# Patient Record
Sex: Female | Born: 1970 | Hispanic: Yes | Smoking: Never smoker
Health system: Southern US, Community
[De-identification: ages and names within clinical notes are randomized; demographics above are authoritative.]

## PROBLEM LIST (undated history)

## (undated) DIAGNOSIS — J45909 Unspecified asthma, uncomplicated: Secondary | ICD-10-CM

## (undated) DIAGNOSIS — Z789 Other specified health status: Secondary | ICD-10-CM

## (undated) HISTORY — PX: RECTAL SURGERY: SHX760

---

## 2008-09-20 ENCOUNTER — Inpatient Hospital Stay (HOSPITAL_COMMUNITY): Admission: EM | Admit: 2008-09-20 | Discharge: 2008-09-21 | Payer: Self-pay | Admitting: Emergency Medicine

## 2010-04-09 LAB — CBC
HCT: 36.4 % (ref 36.0–46.0)
Hemoglobin: 12.7 g/dL (ref 12.0–15.0)
MCHC: 34.8 g/dL (ref 30.0–36.0)
MCV: 96.9 fL (ref 78.0–100.0)
Platelets: 203 10*3/uL (ref 150–400)
RBC: 3.76 MIL/uL — ABNORMAL LOW (ref 3.87–5.11)
RDW: 12.4 % (ref 11.5–15.5)
WBC: 6.5 10*3/uL (ref 4.0–10.5)

## 2010-04-09 LAB — BASIC METABOLIC PANEL WITH GFR
BUN: 5 mg/dL — ABNORMAL LOW (ref 6–23)
CO2: 24 meq/L (ref 19–32)
Calcium: 9.1 mg/dL (ref 8.4–10.5)
Chloride: 104 meq/L (ref 96–112)
Creatinine, Ser: 0.76 mg/dL (ref 0.4–1.2)
GFR calc non Af Amer: >60 mL/min
Glucose, Bld: 93 mg/dL (ref 70–99)
Potassium: 3.7 meq/L (ref 3.5–5.1)
Sodium: 135 meq/L (ref 135–145)

## 2010-04-09 LAB — DIFFERENTIAL
Eosinophils Absolute: 0.1 10*3/uL (ref 0.0–0.7)
Lymphs Abs: 1.7 10*3/uL (ref 0.7–4.0)
Monocytes Relative: 3 % (ref 3–12)
Neutro Abs: 4.5 10*3/uL (ref 1.7–7.7)
Neutrophils Relative %: 69 % (ref 43–77)

## 2010-04-16 ENCOUNTER — Inpatient Hospital Stay (INDEPENDENT_AMBULATORY_CARE_PROVIDER_SITE_OTHER)
Admission: RE | Admit: 2010-04-16 | Discharge: 2010-04-16 | Disposition: A | Discharge status: Home / Self Care | Payer: Medicaid Other | Source: Ambulatory Visit | Attending: Emergency Medicine | Admitting: Emergency Medicine

## 2010-04-16 DIAGNOSIS — J309 Allergic rhinitis, unspecified: Secondary | ICD-10-CM

## 2011-07-22 ENCOUNTER — Encounter (HOSPITAL_COMMUNITY): Payer: Self-pay | Admitting: Nurse Practitioner

## 2011-07-22 ENCOUNTER — Emergency Department (HOSPITAL_COMMUNITY)
Admission: EM | Admit: 2011-07-22 | Discharge: 2011-07-22 | Disposition: A | Discharge status: Home / Self Care | Payer: Medicaid Other | Source: Home / Self Care | Attending: Emergency Medicine | Admitting: Emergency Medicine

## 2011-07-22 DIAGNOSIS — J029 Acute pharyngitis, unspecified: Secondary | ICD-10-CM

## 2011-07-22 DIAGNOSIS — J45909 Unspecified asthma, uncomplicated: Secondary | ICD-10-CM

## 2011-07-22 MED ORDER — CETIRIZINE HCL 10 MG PO CHEW: 10.0000 mg | CHEWABLE_TABLET | Freq: Every day | ORAL | Status: DC

## 2011-07-22 MED ORDER — PREDNISONE 20 MG PO TABS: 40.0000 mg | ORAL_TABLET | Freq: Every day | ORAL | Status: AC

## 2011-07-22 MED ORDER — ALBUTEROL SULFATE HFA 108 (90 BASE) MCG/ACT IN AERS: 1.0000 | INHALATION_SPRAY | Freq: Four times a day (QID) | RESPIRATORY_TRACT | Status: DC | PRN

## 2011-07-22 NOTE — ED Notes (Signed)
Pt. C/o sore and itchy throat for past month. Pt. States she has occasional HA and runny nose.

## 2011-07-22 NOTE — ED Provider Notes (Signed)
History     CSN: [id number]  Arrival date & time 07/22/11  1104   First MD Initiated Contact with Patient 07/22/11 1117      Chief Complaint  Patient presents with  . Sore Throat  . Allergies    (Consider location/radiation/quality/duration/timing/severity/associated sxs/prior treatment) HPI Comments: Patient presents urgent care complaining of a recurrent, ongoing cough with associated wheezing and occasional itchy and a sore throat. Symptoms seem to get well on its own but occasionally last 5-6 days. Today mild headache with a minimal runny nose but no bodyaches, no fevers, no changes in appetite. Patient denies any arthralgias myalgias or abdominal pain. 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  Patient is a 41 y.o. female presenting with pharyngitis. The history is provided by the patient.  Sore Throat This is a new problem. The current episode started more than 1 week ago. The problem occurs constantly. The problem has not changed since onset.Pertinent negatives include no chest pain, no headaches and no shortness of breath. The symptoms are aggravated by swallowing. Nothing relieves the symptoms. She has tried nothing for the symptoms.    History reviewed. No pertinent past medical history.  History reviewed. No pertinent past surgical history.  No family history on file.  History  Substance Use Topics  . Smoking status: Not on file  . Smokeless tobacco: Not on file  . Alcohol Use: Not on file    OB History    Grav Para Term Preterm Abortions TAB SAB Ect Mult Living                  Review of Systems  Constitutional: Negative for fever, chills, activity change and unexpected weight change.  Eyes: Negative for visual disturbance.  Respiratory: Positive for cough. Negative for shortness of breath, wheezing and stridor.   Cardiovascular: Negative for chest pain.  Musculoskeletal: Negative for myalgias, back pain  and joint swelling.  Skin: Negative for color change, rash and wound.  Neurological: Negative for dizziness and headaches.    Allergies  Review of patient's allergies indicates no known allergies.  Home Medications   Current Outpatient Rx  Name Route Sig Dispense Refill  . ALBUTEROL SULFATE HFA 108 (90 BASE) MCG/ACT IN AERS Inhalation Inhale 1-2 puffs into the lungs every 6 (six) hours as needed for wheezing. 1 Inhaler 0  . CETIRIZINE HCL 10 MG PO CHEW Oral Chew 1 tablet (10 mg total) by mouth daily. 14 tablet 0  . PREDNISONE 20 MG PO TABS Oral Take 2 tablets (40 mg total) by mouth daily. 2 tablets daily for 5 days 10 tablet 0    BP 119/79  Pulse 76  Temp 99.1 F (37.3 C) (Oral)  Resp 16  SpO2 100%  LMP 06/29/2011  Physical Exam  Nursing note and vitals reviewed. Constitutional: Vital signs are normal. She appears well-developed and well-nourished.  Non-toxic appearance. She does not have a sickly appearance. She does not appear ill. No distress.  Pulmonary/Chest: Effort normal and breath sounds normal. She has no decreased breath sounds. She has no wheezes. She has no rhonchi. She has no rales.  Abdominal: Soft. She exhibits no distension. There is no hepatosplenomegaly. There is no tenderness. There is no rebound.  Neurological: She is alert.  Skin: No erythema.    ED Course  Procedures (including critical care time)   Labs Reviewed  POCT RAPID STREP A ([facility] ONLY)   No results found.  1. Reactive airway disease   2. Pharyngitis       MDM  Recurrent upper respiratory symptoms most likely induced by repetitive and recurrent chemical exposure. Today patient has obtained prescriptions for Zyrtec for a cycle of 2 weeks prednisone for 5 days and albuterol. Symptoms and signs were not consistent with a respiratory infection. Patient agrees with treatment plan and was instructed to followup if no improvement after 7-10 days.        Jimmie Molly, MD 07/22/11  1153

## 2012-04-27 ENCOUNTER — Emergency Department (HOSPITAL_COMMUNITY)
Admission: EM | Admit: 2012-04-27 | Discharge: 2012-04-27 | Disposition: A | Discharge status: Home / Self Care | Payer: Medicaid Other | Source: Home / Self Care | Attending: Family Medicine | Admitting: Family Medicine

## 2012-04-27 ENCOUNTER — Encounter (HOSPITAL_COMMUNITY): Payer: Self-pay | Admitting: *Deleted

## 2012-04-27 DIAGNOSIS — J302 Other seasonal allergic rhinitis: Secondary | ICD-10-CM

## 2012-04-27 DIAGNOSIS — Z3201 Encounter for pregnancy test, result positive: Secondary | ICD-10-CM

## 2012-04-27 DIAGNOSIS — J309 Allergic rhinitis, unspecified: Secondary | ICD-10-CM

## 2012-04-27 DIAGNOSIS — Z331 Pregnant state, incidental: Secondary | ICD-10-CM

## 2012-04-27 MED ORDER — BUDESONIDE 32 MCG/ACT NA SUSP: 1.0000 | Freq: Two times a day (BID) | NASAL | Status: DC

## 2012-04-27 MED ORDER — DIPHENHYDRAMINE HCL 25 MG PO TABS: 25.0000 mg | ORAL_TABLET | Freq: Four times a day (QID) | ORAL | Status: DC

## 2012-04-27 NOTE — ED Provider Notes (Signed)
History     CSN: [id number]  Arrival date & time 04/27/12  1255   First MD Initiated Contact with Patient 04/27/12 1316      Chief Complaint  Patient presents with  . Nasal Congestion    (Consider location/radiation/quality/duration/timing/severity/associated sxs/prior treatment) Patient is a 42 y.o. female presenting with URI. The history is provided by the patient.  URI Presenting symptoms: congestion, cough and rhinorrhea   Presenting symptoms: no sore throat   Severity:  Moderate Duration:  1 week Progression:  Worsening Chronicity:  Recurrent Associated symptoms: sneezing   Associated symptoms: no wheezing   Risk factors comment:  Concern for being pregnant.   History reviewed. No pertinent past medical history.  No past surgical history on file.  History reviewed. No pertinent family history.  History  Substance Use Topics  . Smoking status: Never Smoker   . Smokeless tobacco: Not on file  . Alcohol Use: No    OB History   Grav Para Term Preterm Abortions TAB SAB Ect Mult Living                  Review of Systems  Constitutional: Negative.   HENT: Positive for congestion, rhinorrhea, sneezing and postnasal drip. Negative for sore throat and trouble swallowing.   Respiratory: Positive for cough. Negative for wheezing.   Cardiovascular: Negative.     Allergies  Review of patient's allergies indicates no known allergies.  Home Medications   Current Outpatient Rx  Name  Route  Sig  Dispense  Refill  . albuterol (PROVENTIL HFA;VENTOLIN HFA) 108 (90 BASE) MCG/ACT inhaler   Inhalation   Inhale 1-2 puffs into the lungs every 6 (six) hours as needed for wheezing.   1 Inhaler   0   . budesonide (RHINOCORT AQUA) 32 MCG/ACT nasal spray   Nasal   Place 1 spray into the nose 2 (two) times daily.   1 Bottle   1   . cetirizine (ZYRTEC) 10 MG chewable tablet   Oral   Chew 1 tablet (10 mg total) by mouth daily.   14 tablet   0   . diphenhydrAMINE  (BENADRYL) 25 MG tablet   Oral   Take 1 tablet (25 mg total) by mouth every 6 (six) hours. For allergies   30 tablet   0     BP 119/70  Pulse 77  Temp(Src) 98.2 F (36.8 C) (Oral)  Resp 16  SpO2 100%  LMP 02/28/2012  Physical Exam  Nursing note and vitals reviewed. Constitutional: She is oriented to person, place, and time. She appears well-developed and well-nourished.  HENT:  Head: Normocephalic.  Right Ear: External ear normal.  Left Ear: External ear normal.  Nose: Mucosal edema and rhinorrhea present.  Mouth/Throat: Oropharynx is clear and moist.  Eyes: Pupils are equal, round, and reactive to light.  Neck: Normal range of motion. Neck supple.  Cardiovascular: Normal rate, regular rhythm, normal heart sounds and intact distal pulses.   Pulmonary/Chest: Effort normal and breath sounds normal.  Abdominal: There is no tenderness.  Neurological: She is alert and oriented to person, place, and time.  Skin: Skin is warm and dry.  Psychiatric: She has a normal mood and affect.    ED Course  Procedures (including critical care time)  Labs Reviewed  POCT PREGNANCY, URINE - Abnormal; Notable for the following:    Preg Test, Ur POSITIVE (*)    All other components within normal limits   No results found.   1. Seasonal  allergic rhinitis   2. Pregnancy as incidental finding       MDM          Linna Hoff, MD 04/27/12 [phone]

## 2012-04-27 NOTE — ED Notes (Signed)
Pt  Reports  Symptoms  Of       Congestion  Allergy  Symptoms           Headaches    For  sev  Months    Pt   Also has  Not  Had  A  Period  In  2  Months      No Birth  Control

## 2012-06-04 ENCOUNTER — Other Ambulatory Visit (HOSPITAL_COMMUNITY)
Admission: RE | Admit: 2012-06-04 | Discharge: 2012-06-04 | Disposition: A | Discharge status: Home / Self Care | Payer: Medicaid Other | Source: Ambulatory Visit | Attending: Obstetrics and Gynecology | Admitting: Obstetrics and Gynecology

## 2012-06-04 ENCOUNTER — Ambulatory Visit (INDEPENDENT_AMBULATORY_CARE_PROVIDER_SITE_OTHER): Payer: Medicaid Other | Admitting: Obstetrics and Gynecology

## 2012-06-04 ENCOUNTER — Encounter: Payer: Self-pay | Admitting: Obstetrics and Gynecology

## 2012-06-04 VITALS — BP 113/66 | Temp 97.2°F | Ht 67.0 in | Wt 149.2 lb

## 2012-06-04 DIAGNOSIS — O09219 Supervision of pregnancy with history of pre-term labor, unspecified trimester: Secondary | ICD-10-CM | POA: Insufficient documentation

## 2012-06-04 DIAGNOSIS — J4520 Mild intermittent asthma, uncomplicated: Secondary | ICD-10-CM

## 2012-06-04 DIAGNOSIS — O09529 Supervision of elderly multigravida, unspecified trimester: Secondary | ICD-10-CM | POA: Insufficient documentation

## 2012-06-04 DIAGNOSIS — J45909 Unspecified asthma, uncomplicated: Secondary | ICD-10-CM

## 2012-06-04 DIAGNOSIS — O09212 Supervision of pregnancy with history of pre-term labor, second trimester: Secondary | ICD-10-CM

## 2012-06-04 DIAGNOSIS — Z1151 Encounter for screening for human papillomavirus (HPV): Secondary | ICD-10-CM | POA: Insufficient documentation

## 2012-06-04 DIAGNOSIS — Z01419 Encounter for gynecological examination (general) (routine) without abnormal findings: Secondary | ICD-10-CM | POA: Insufficient documentation

## 2012-06-04 DIAGNOSIS — Z113 Encounter for screening for infections with a predominantly sexual mode of transmission: Secondary | ICD-10-CM | POA: Insufficient documentation

## 2012-06-04 LAB — POCT URINALYSIS DIP (DEVICE)
Leukocytes, UA: NEGATIVE
Protein, ur: NEGATIVE mg/dL
Specific Gravity, Urine: >=1.03 (ref 1.005–1.030)
Urobilinogen, UA: 1 mg/dL (ref 0.0–1.0)

## 2012-06-04 NOTE — Progress Notes (Signed)
   Subjective:    Monica Blake is a [id number] [redacted]w[redacted]d being seen today for her first obstetrical visit.  Her obstetrical history is significant for PROM/PTB with second pregnancy (records pending). RF for GDM (Hispanic and AMA) but early glucola not done at [facility] (near 5 pm so will come back for lab). Patient does intend to breast feed. Pregnancy history fully reviewed.  Patient reports breasts sore and occasional nausea.  Filed Vitals:   06/04/12 1521 06/04/12 1533  BP: 113/66   Temp: 97.2 F (36.2 C)   Height:  5\' 7"  (1.702 m)  Weight: 67.677 kg (149 lb 3.2 oz)     HISTORY: OB History   Grav Para Term Preterm Abortions TAB SAB Ect Mult Living   4 3 2 1      3      # Outc Date GA Lbr Len/2nd Wgt Sex Del Anes PTL Lv   1 TRM 9/96 [redacted]w[redacted]d  3.175kg(7lb) F SVD None  Yes   2 PRE 7/98 [redacted]w[redacted]d  1.361kg(3lb) M SVD None Yes Yes   3 TRM 11/00 [redacted]w[redacted]d  3.629kg(8lb) M SVD None  Yes   4 CUR              Past Medical History  Diagnosis Date  . Preterm labor     1998   Past Surgical History  Procedure Laterality Date  . Rectal surgery     History reviewed. No pertinent family history.   Exam    Uterus:     Pelvic Exam:    Perineum: Normal Perineum   Vulva: normal, Bartholin's, Urethra, Skene's normal   Vagina:  normal mucosa, normal discharge       Cervix: multiparous appearance   Adnexa: not evaluated   Bony Pelvis: gynecoid  System: Breast:  normal appearance, no masses or tenderness   Skin: normal coloration and turgor, no rashes    Neurologic: oriented, normal, grossly non-focal   Extremities: normal strength, tone, and muscle mass   HEENT PERRLA, neck supple with midline trachea and thyroid without masses   Mouth/Teeth mucous membranes moist, pharynx normal without lesions and dental hygiene good   Neck supple and no masses   Cardiovascular: regular rate and rhythm no murmur   Respiratory:  appears well, vitals normal, no respiratory distress, acyanotic, normal RR, ear and  throat exam is normal, neck free of mass or lymphadenopathy, chest clear, no wheezing, crepitations, rhonchi, normal symmetric air entry   Abdomen: S=D. DT 150, NT   Urinary: urethral meatus normal      Assessment:    Pregnancy: [id number] Patient Active Problem List   Diagnosis Date Noted  . AMA (advanced maternal age) multigravida 35+ 06/04/2012  . Pregnancy complicated by previous preterm labor 06/04/2012        Plan:     Initial labs drawn. Prenatal vitamins. Problem list reviewed and updated. Genetic Screening discussed [place] Screen: undecided.  Ultrasound discussed; fetal survey: requested.  Follow up in 2 weeks. Will get glucola and F/U on offered genetic testing and 17-P 50% of 30 min visit spent on counseling and coordination of care.  Interpreter used   [employer] 06/04/2012

## 2012-06-04 NOTE — Patient Instructions (Signed)
Embarazo - Segundo trimestre (Pregnancy - Second Trimester) El segundo trimestre del embarazo (del 3 al 6 mes) es un perodo de evolucin rpida para usted y el beb. Hacia el final del sexto mes, el beb mide aproximadamente 23 cm y pesa 680 g. Comenzar a sentir los movimientos del beb entre las 18 y las 20 semanas de embarazo. Podr sentir las pataditas ("quickening en ingls"). Hay un rpido aumento de peso. Puede segregar un lquido claro (calostro) de las mamas. Quizs sienta pequeas contracciones en el vientre (tero) Esto se conoce como falso trabajo de parto o contracciones de Braxton-Hicks. Es como una prctica del trabajo de parto que se produce cuando el beb est listo para salir. Generalmente los problemas de vmitos matinales ya se han superado hacia el final del primer trimestre. Algunas mujeres desarrollan pequeas manchas oscuras (que se denominan cloasma, mscara del embarazo) en la cara que normalmente se van luego del nacimiento del beb. La exposicin al sol empeora las manchas. Puede desarrollarse acn en algunas mujeres embarazadas, y puede desaparecer en aquellas que ya tienen acn. EXAMENES PRENATALES  Durante los exmenes prenatales, deber seguir realizando pruebas de sangre, segn avance el embarazo. Estas pruebas se realizan para controlar su salud y la del beb. Tambin se realizan anlisis de sangre para conocer los niveles de hemoglobina. La anemia (bajo nivel de hemoglobina) es frecuente durante el embarazo. Para prevenirla, se administran hierro y vitaminas. Tambin se le realizarn exmenes para saber si tiene diabetes entre las 24 y las 28 semanas del embarazo. Podrn repetirle algunas de las pruebas que le hicieron previamente.  En cada visita le medirn el tamao del tero. Esto se realiza para asegurarse de que el beb est creciendo correctamente de acuerdo al estado del embarazo.  Tambin en cada visita prenatal controlarn su presin arterial. Esto se realiza  para asegurarse de que no tenga toxemia.  Se controlar su orina para asegurarse de que no tenga infecciones, diabetes o protena en la orina.  Se controlar su peso regularmente para asegurarse que el aumento ocurre al ritmo indicado. Esto se hace para asegurarse que usted y el beb tienen una evolucin normal.  En algunas ocasiones se realiza una prueba de ultrasonido para confirmar el correcto desarrollo y evolucin del beb. Esta prueba se realiza con ondas sonoras inofensivas para el beb, de modo que el profesional pueda calcular ms precisamente la fecha del parto. Algunas veces se realizan pruebas especializadas del lquido amnitico que rodea al beb. Esta prueba se denomina amniocentesis. El lquido amnitico se obtiene introduciendo una aguja en el vientre (abdomen). Se realiza para controlar los cromosomas en aquellos casos en los que existe alguna preocupacin acerca de algn problema gentico que pueda sufrir el beb. En ocasiones se lleva a cabo cerca del final del embarazo, si es necesario inducir al parto. En este caso se realiza para asegurarse que los pulmones del beb estn lo suficientemente maduros como para que pueda vivir fuera del tero. CAMBIOS QUE OCURREN EN EL SEGUNDO TRIMESTRE DEL EMBARAZO Su organismo atravesar numerosos cambios durante el embarazo. Estos pueden variar de una persona a otra. Converse con el profesional que la asiste acerca los cambios que usted note y que la preocupen.  Durante el segundo trimestre probablemente sienta un aumento del apetito. Es normal tener "antojos" de ciertas comidas. Esto vara de una persona a otra y de un embarazo a otro.  El abdomen inferior comenzar a abultarse.  Podr tener la necesidad de orinar con ms frecuencia debido a   que el tero y el beb presionan sobre la vejiga. Tambin es frecuente contraer ms infecciones urinarias durante el embarazo. Puede evitarlas bebiendo gran cantidad de lquidos y vaciando la vejiga antes y  despus de mantener relaciones sexuales.  Podrn aparecer las primeras estras en las caderas, abdomen y mamas. Estos son cambios normales del cuerpo durante el embarazo. No existen medicamentos ni ejercicios que puedan prevenir estos cambios.  Es posible que comience a desarrollar venas inflamadas y abultadas (varices) en las piernas. El uso de medias de descanso, elevar sus pies durante 15 minutos, 3 a 4 veces al da y limitar la sal en su dieta ayuda a aliviar el problema.  Podr sentir acidez gstrica a medida que el tero crece y presiona contra el estmago. Puede tomar anticidos, con la autorizacin de su mdico, para aliviar este problema. Tambin es til ingerir pequeas comidas 4 a 5 veces al da.  La constipacin puede tratarse con un laxante o agregando fibra a su dieta. Beber grandes cantidades de lquidos, comer vegetales, frutas y granos integrales es de gran ayuda.  Tambin es beneficioso practicar actividad fsica. Si ha sido una persona activa hasta el embarazo, podr continuar con la mayora de las actividades durante el mismo. Si ha sido menos activa, puede ser beneficioso que comience con un programa de ejercicios, como realizar caminatas.  Puede desarrollar hemorroides hacia el final del segundo trimestre. Tomar baos de asiento tibios y utilizar cremas recomendadas por el profesional que lo asiste sern de ayuda para los problemas de hemorroides.  Tambin podr sentir dolor de espalda durante este momento de su embarazo. Evite levantar objetos pesados, utilice zapatos de taco bajo y mantenga una buena postura para ayudar a reducir los problemas de espalda.  Algunas mujeres embarazadas desarrollan hormigueo y adormecimiento de la mano y los dedos debido a la hinchazn y compresin de los ligamentos de la mueca (sndrome del tnel carpiano). Esto desaparece una vez que el beb nace.  Como sus pechos se agrandan, necesitar un sujetador ms grande. Use un sostn de soporte,  cmodo y de algodn. No utilice un sostn para amamantar hasta el ltimo mes de embarazo si va a amamantar al beb.  Podr observar una lnea oscura desde el ombligo hacia la zona pbica denominada linea nigra.  Podr observar que sus mejillas se ponen coloradas debido al aumento de flujo sanguneo en la cara.  Podr desarrollar "araitas" en la cara, cuello y pecho. Esto desaparece una vez que el beb nace. INSTRUCCIONES PARA EL CUIDADO DOMICILIARIO  Es extremadamente importante que evite el cigarrillo, hierbas medicinales, alcohol y las drogas no prescriptas durante el embarazo. Estas sustancias qumicas afectan la formacin y el desarrollo del beb. Evite estas sustancias durante todo el embarazo para asegurar el nacimiento de un beb sano.  La mayor parte de los cuidados que se aconsejan son los mismos que los indicados para el primer trimestre del embarazo. Cumpla con las citas tal como se le indic. Siga las instrucciones del profesional que lo asiste con respecto al uso de los medicamentos, el ejercicio y la dieta.  Durante el embarazo debe obtener nutrientes para usted y para su beb. Consuma alimentos balanceados a intervalos regulares. Elija alimentos como carne, pescado, leche y otros productos lcteos descremados, vegetales, frutas, panes integrales y cereales. El profesional le informar cul es el aumento de peso ideal.  Las relaciones sexuales fsicas pueden continuarse hasta cerca del fin del embarazo si no existen otros problemas. Estos problemas pueden ser la prdida temprana (  prematura) de lquido amnitico de las membranas, sangrado vaginal, dolor abdominal u otros problemas mdicos o del embarazo.  Realice actividad fsica todos los das, si no tiene restricciones. Consulte con el profesional que la asiste si no sabe con certeza si determinados ejercicios son seguros. El mayor aumento de peso tiene lugar durante los ltimos 2 trimestres del embarazo. El ejercicio la ayudar  a:  Controlar su peso.  Ponerla en forma para el parto.  Ayudarla a perder peso luego de haber dado a luz.  Use un buen sostn o como los que se usan para hacer deportes para aliviar la sensibilidad de las mamas. Tambin puede serle til si lo usa mientras duerme. Si pierde calostro, podr utilizar apsitos en el sostn.  No utilice la baera con agua caliente, baos turcos y saunas durante el embarazo.  Utilice el cinturn de seguridad sin excepcin cuando conduzca. Este la proteger a usted y al beb en caso de accidente.  Evite comer carne cruda, queso crudo, y el contacto con los utensilios y desperdicios de los gatos. Estos elementos contienen grmenes que pueden causar defectos de nacimiento en el beb.  El segundo trimestre es un buen momento para visitar a su dentista y evaluar su salud dental si an no lo ha hecho. Es importante mantener los dientes limpios. Utilice un cepillo de dientes blando. Cepllese ms suavemente durante el embarazo.  Es ms fcil perder algo de orina durante el embarazo. Apretar y fortalecer los msculos de la pelvis la ayudar con este problema. Practique detener la miccin cuando est en el bao. Estos son los mismos msculos que necesita fortalecer. Son tambin los mismos msculos que utiliza cuando trata de evitar los gases. Puede practicar apretando estos msculos 10 veces, y repetir esto 3 veces por da aproximadamente. Una vez que conozca qu msculos debe apretar, no realice estos ejercicios durante la miccin. Puede favorecerle una infeccin si la orina vuelve hacia atrs.  Pida ayuda si tiene necesidades econmicas, de asesoramiento o nutricionales durante el embarazo. El profesional podr ayudarla con respecto a estas necesidades, o derivarla a otros especialistas.  La piel puede ponerse grasa. Si esto sucede, lvese la cara con un jabn suave, utilice un humectante no graso y maquillaje con base de aceite o crema. CONSUMO DE MEDICAMENTOS Y DROGAS  DURANTE EL EMBARAZO  Contine tomando las vitaminas apropiadas para esta etapa tal como se le indic. Las vitaminas deben contener un miligramo de cido flico y deben suplementarse con hierro. Guarde todas las vitaminas fuera del alcance de los nios. La ingestin de slo un par de vitaminas o tabletas que contengan hierro puede ocasionar la muerte en un beb o en un nio pequeo.  Evite el uso de medicamentos, inclusive los de venta libre y hierbas que no hayan sido prescriptos o indicados por el profesional que la asiste. Algunos medicamentos pueden causar problemas fsicos al beb. Utilice los medicamentos de venta libre o de prescripcin para el dolor, el malestar o la fiebre, segn se lo indique el profesional que lo asiste. No utilice aspirina.  El consumo de alcohol est relacionado con ciertos defectos de nacimiento. Esto incluye el sndrome de alcoholismo fetal. Debe evitar el consumo de alcohol en cualquiera de sus formas. El cigarrillo causa nacimientos prematuros y bebs de bajo peso. El uso de drogas recreativas est absolutamente prohibido. Son muy nocivas para el beb. Un beb que nace de una madre adicta, ser adicto al nacer. Ese beb tendr los mismos sntomas de abstinencia que un adulto.    Infrmele al profesional si consume alguna droga.  No consuma drogas ilegales. Pueden causarle mucho dao al beb. SOLICITE ATENCIN MDICA SI: Tiene preguntas o preocupaciones durante su embarazo. Es mejor que llame para consultar las dudas que esperar hasta su prxima visita prenatal. De esta forma se sentir ms tranquila.  SOLICITE ATENCIN MDICA DE INMEDIATO SI:  La temperatura oral se eleva sin motivo por encima de 102 F (38.9 C) o segn le indique el profesional que lo asiste.  Tiene una prdida de lquido por la vagina (canal de parto). Si sospecha una ruptura de las membranas, tmese la temperatura y llame al profesional para informarlo sobre esto.  Observa unas pequeas manchas,  una hemorragia vaginal o elimina cogulos. Notifique al profesional acerca de la cantidad y de cuntos apsitos est utilizando. Unas pequeas manchas de sangre son algo comn durante el embarazo, especialmente despus de mantener relaciones sexuales.  Presenta un olor desagradable en la secrecin vaginal y observa un cambio en el color, de transparente a blanco.  Contina con las nuseas y no obtiene alivio de los remedios indicados. Vomita sangre o algo similar a la borra del caf.  [place] o sube ms de 900 g. en una semana, o segn lo indicado por el profesional que la asiste.  Observa que se le hinchan el rostro, las manos, los pies o las piernas.  Ha estado expuesta a la rubola y no ha sufrido la enfermedad.  Ha estado expuesta a la quinta enfermedad o a la varicela.  Presenta dolor abdominal. Las molestias en el ligamento redondo son una causa no cancerosa (benigna) frecuente de dolor abdominal durante el embarazo. El profesional que la asiste deber evaluarla.  Presenta dolor de cabeza intenso que no se alivia.  Presenta fiebre, diarrea, dolor al orinar o le falta la respiracin.  Presenta dificultad para ver, visin borrosa, o visin doble.  Sufre una cada, un accidente de trnsito o cualquier tipo de trauma.  Vive en un hogar en el que existe violencia fsica o mental. Document Released: 09/29/2004 Document Revised: 09/14/2011 [employer] Patient Information 2014 [employer].  

## 2012-06-04 NOTE — Progress Notes (Signed)
Pulse- 77  Pain-breast tenderness Weight gain 25-35lb  New ob packet given

## 2012-06-05 LAB — OBSTETRIC PANEL
Antibody Screen: NEGATIVE
Eosinophils Relative: 7 % — ABNORMAL HIGH (ref 0–5)
HCT: 34 % — ABNORMAL LOW (ref 36.0–46.0)
Lymphocytes Relative: 33 % (ref 12–46)
Lymphs Abs: 2 10*3/uL (ref 0.7–4.0)
MCV: 92.1 fL (ref 78.0–100.0)
Monocytes Absolute: 0.3 10*3/uL (ref 0.1–1.0)
RBC: 3.69 MIL/uL — ABNORMAL LOW (ref 3.87–5.11)
Rubella: 4.17 Index — ABNORMAL HIGH (ref <0.90)
WBC: 6.1 10*3/uL (ref 4.0–10.5)

## 2012-06-05 LAB — HIV ANTIBODY (ROUTINE TESTING W REFLEX): HIV: NONREACTIVE

## 2012-06-06 LAB — HEMOGLOBINOPATHY EVALUATION
Hemoglobin Other: 0 %
Hgb A: 96.9 % (ref 96.8–97.8)

## 2012-06-18 ENCOUNTER — Ambulatory Visit (INDEPENDENT_AMBULATORY_CARE_PROVIDER_SITE_OTHER): Payer: Medicaid Other | Admitting: Family

## 2012-06-18 VITALS — BP 100/69 | Wt 148.6 lb

## 2012-06-18 DIAGNOSIS — O0992 Supervision of high risk pregnancy, unspecified, second trimester: Secondary | ICD-10-CM

## 2012-06-18 DIAGNOSIS — O09529 Supervision of elderly multigravida, unspecified trimester: Secondary | ICD-10-CM

## 2012-06-18 DIAGNOSIS — O0990 Supervision of high risk pregnancy, unspecified, unspecified trimester: Secondary | ICD-10-CM | POA: Insufficient documentation

## 2012-06-18 NOTE — Progress Notes (Signed)
Reviewed screening for genetic issues in pregnancy and increased risk due to age > [place] Screen and detailed ultrasound; early 1 hr today.  Urine results not available at discharge.

## 2012-06-18 NOTE — Progress Notes (Signed)
Pulse: 79 Couldn't afford the rx for nose spray. Would like a cheaper alternative.

## 2012-06-19 ENCOUNTER — Encounter: Payer: Self-pay | Admitting: Family

## 2012-07-03 ENCOUNTER — Ambulatory Visit (HOSPITAL_COMMUNITY)
Admission: RE | Admit: 2012-07-03 | Discharge: 2012-07-03 | Disposition: A | Discharge status: Home / Self Care | Payer: Medicaid Other | Source: Ambulatory Visit | Attending: Family | Admitting: Family

## 2012-07-03 ENCOUNTER — Encounter (HOSPITAL_COMMUNITY): Payer: Self-pay

## 2012-07-03 ENCOUNTER — Other Ambulatory Visit: Payer: Self-pay

## 2012-07-03 DIAGNOSIS — Z1389 Encounter for screening for other disorder: Secondary | ICD-10-CM | POA: Insufficient documentation

## 2012-07-03 DIAGNOSIS — Z8751 Personal history of pre-term labor: Secondary | ICD-10-CM | POA: Insufficient documentation

## 2012-07-03 DIAGNOSIS — O0992 Supervision of high risk pregnancy, unspecified, second trimester: Secondary | ICD-10-CM

## 2012-07-03 DIAGNOSIS — O09299 Supervision of pregnancy with other poor reproductive or obstetric history, unspecified trimester: Secondary | ICD-10-CM | POA: Insufficient documentation

## 2012-07-03 DIAGNOSIS — O358XX0 Maternal care for other (suspected) fetal abnormality and damage, not applicable or unspecified: Secondary | ICD-10-CM | POA: Insufficient documentation

## 2012-07-03 DIAGNOSIS — Z363 Encounter for antenatal screening for malformations: Secondary | ICD-10-CM | POA: Insufficient documentation

## 2012-07-03 DIAGNOSIS — O09529 Supervision of elderly multigravida, unspecified trimester: Secondary | ICD-10-CM | POA: Insufficient documentation

## 2012-07-03 NOTE — Progress Notes (Signed)
Genetic Counseling  High-Risk Gestation Note  Appointment Date:  07/03/2012 Referred By: Melissa Noon, CNM Date of Birth:  [birth year]    Pregnancy History: [id number] Estimated Date of Delivery: 11/30/12 Estimated Gestational Age: [redacted]w[redacted]d Attending: Particia Nearing, MD  [name] was seen for genetic counseling because of a maternal age of 42 y.o.. [facility] Spanish/English medical interpreter, Monica Blake was present and provided interpretation during today's session.    She was counseled regarding maternal age and the association with risk for chromosome conditions due to nondisjunction with aging of the ova.   We reviewed chromosomes, nondisjunction, and the associated 1 in 59 risk for fetal aneuploidy related to a maternal age of 42 y.o. at [redacted]w[redacted]d gestation.  She was counseled that the risk for aneuploidy decreases as gestational age increases, accounting for those pregnancies which spontaneously abort.  We specifically discussed Down syndrome (trisomy 23), trisomies 78 and 69, and sex chromosome aneuploidies (47,XXX and 47,XXY) including the common features and prognoses of each.   We reviewed available screening options including Quad screen, noninvasive prenatal screening (NIPS)/cell free fetal DNA (cffDNA) testing, and detailed ultrasound.  She was counseled that screening tests are used to modify a patient's a priori risk for aneuploidy, typically based on age. This estimate provides a pregnancy specific risk assessment. We reviewed the benefits and limitations of each option. Specifically, we discussed the conditions for which each test screens, the detection rates, and false positive rates of each. She was also counseled regarding diagnostic testing via amniocentesis. We reviewed the approximate 1 in 300-500 risk for complications for amniocentesis, including spontaneous pregnancy loss. After consideration of all the options, she elected to proceed with NIPS (Panorama) at the time of  today's visit.  Those results will be available in approximately 8-10 days.    The patient also expressed interest in having a detailed ultrasound.  A complete ultrasound was performed today. The ultrasound report will be sent under separate cover. There were no visualized fetal anomalies or markers suggestive of aneuploidy. Diagnostic testing was declined today given the associated risk of complications.  She understands that screening tests cannot rule out all birth defects or genetic syndromes. The patient was advised of this limitation.   [name] was provided with written information regarding sickle cell anemia (SCA) including the carrier frequency and incidence in the Hispanic and African populations, the availability of carrier testing and prenatal diagnosis if indicated.  In addition, we discussed that hemoglobinopathies are routinely screened for as part of the [place] newborn screening panel.  She previously had hemoglobin electrophoresis performed through her OB office, which indicated the presence of normal adult hemoglobin. Thus, she does not appear to carry sickle cell trait or other hemoglobin variant.   Both family histories were reviewed and found to be contributory for speech delays for the patient's maternal uncle. He reportedly also has behavior issues and will get angry easily. He lives with his sister, who cares for him. He is otherwise healthy. No underlying cause is known for his cognitive delays, and he reportedly has no physical differences from relatives. [name] was counseled that there are many different causes of intellectual disabilities including environmental, multifactorial, and genetic etiologies.  We discussed that a specific diagnosis for intellectual disability can be determined in approximately 50% of these individuals.  In the remaining 50% of individuals, a diagnosis may never be determined.  Regarding genetic causes, we discussed that chromosome aberrations  (aneuploidy, deletions, duplications, insertions, and translocations) are responsible  for a small percentage of individuals with intellectual disability.  Many individuals with chromosome aberrations have additional differences, including congenital anomalies or minor dysmorphisms.  Likewise, single gene conditions are the underlying cause of intellectual delay in some families.  We discussed that many gene conditions have intellectual disability as a feature, but also often include other physical or medical differences. We discussed that without more specific information, it is difficult to provide an accurate risk assessment.  Further genetic counseling is warranted if more information is obtained.  Additionally, the patient reported that her mother has a history of 3 or 4 miscarriages. An underlying cause is not known. Approximately 1 in 6 confirmed pregnancies results in miscarriage. A single underlying cause is more likely to be suspected when a couple has experienced 3 or more losses. Several possible causes including chromosome rearrangements, antibodies, and thrombophilia. We discussed that for the majority of cases, a relative with recurrent pregnancy loss would not likely have implications for other relatives. However, additional information is needed regarding the underlying etiology in order to more accurately assess potential implications for relatives. Without further information regarding the provided family history, an accurate genetic risk cannot be calculated. Further genetic counseling is warranted if more information is obtained.  [name] reported that the father of the pregnancy is 12 years old. Advanced paternal age (APA) is defined as paternal age greater than or equal to age 4.  Recent large-scale sequencing studies have shown that approximately 80% of de novo point mutations are of paternal origin.  Many studies have demonstrated a strong correlation between increased paternal age  and de novo point mutations.  Although no specific data is available regarding fetal risks for fathers 11+ years old at conception, it is apparent that the overall risk for single gene conditions is increased.  To estimate the relative increase in risk of a genetic disorder with APA, the heritability of the disease must be considered.  Assuming an approximate 2x increase in risk for conditions that are exclusively paternal in origin, the risk for each individual condition is still relatively low.  It is estimated that the overall chance for a de novo mutation is ~0.5%.  We also discussed the wide range of conditions which can be caused by new dominant gene mutations (achondroplasia, neurofibromatosis, Marfan syndrome etc.).  She was counseled that genetic testing for each individual single gene condition is not warranted or available unless ultrasound or family history concerns lend suspicion to a specific condition.        [name] denied exposure to environmental toxins or chemical agents. She denied the use of alcohol, tobacco or street drugs. She denied significant viral illnesses during the course of her pregnancy. Her medical and surgical histories were contributory for history of preterm delivery with her second child.   I counseled [name] regarding the above risks and available options.  The approximate face-to-face time with the genetic counselor was 45 minutes.  Quinn Plowman, MS,  Certified Genetic Counselor 07/03/2012

## 2012-07-05 ENCOUNTER — Encounter: Payer: Self-pay | Admitting: Family

## 2012-07-12 ENCOUNTER — Ambulatory Visit (INDEPENDENT_AMBULATORY_CARE_PROVIDER_SITE_OTHER): Payer: Medicaid Other | Admitting: Obstetrics & Gynecology

## 2012-07-12 VITALS — BP 87/60 | Temp 97.5°F | Wt 153.0 lb

## 2012-07-12 DIAGNOSIS — O09212 Supervision of pregnancy with history of pre-term labor, second trimester: Secondary | ICD-10-CM

## 2012-07-12 DIAGNOSIS — O09219 Supervision of pregnancy with history of pre-term labor, unspecified trimester: Secondary | ICD-10-CM

## 2012-07-12 DIAGNOSIS — O0992 Supervision of high risk pregnancy, unspecified, second trimester: Secondary | ICD-10-CM

## 2012-07-12 LAB — POCT URINALYSIS DIP (DEVICE)
Bilirubin Urine: NEGATIVE
Ketones, ur: NEGATIVE mg/dL
Leukocytes, UA: NEGATIVE
pH: 7 (ref 5.0–8.0)

## 2012-07-12 NOTE — Progress Notes (Signed)
Pulse 73 2nd BP: 90/59  C/o pain/parasthesia on left leg.

## 2012-07-12 NOTE — Patient Instructions (Addendum)
Embarazo - Segundo trimestre (Pregnancy - Second Trimester) El segundo trimestre del embarazo (del 3 al 6 mes) es un perodo de evolucin rpida para usted y el beb. Hacia el final del sexto mes, el beb mide aproximadamente 23 cm y pesa 680 g. Comenzar a sentir los movimientos del beb entre las 18 y las 20 semanas de embarazo. Podr sentir las pataditas ("quickening en ingls"). Hay un rpido aumento de peso. Puede segregar un lquido claro (calostro) de las mamas. Quizs sienta pequeas contracciones en el vientre (tero) Esto se conoce como falso trabajo de parto o contracciones de Braxton-Hicks. Es como una prctica del trabajo de parto que se produce cuando el beb est listo para salir. Generalmente los problemas de vmitos matinales ya se han superado hacia el final del primer trimestre. Algunas mujeres desarrollan pequeas manchas oscuras (que se denominan cloasma, mscara del embarazo) en la cara que normalmente se van luego del nacimiento del beb. La exposicin al sol empeora las manchas. Puede desarrollarse acn en algunas mujeres embarazadas, y puede desaparecer en aquellas que ya tienen acn. EXAMENES PRENATALES  Durante los exmenes prenatales, deber seguir realizando pruebas de sangre, segn avance el embarazo. Estas pruebas se realizan para controlar su salud y la del beb. Tambin se realizan anlisis de sangre para conocer los niveles de hemoglobina. La anemia (bajo nivel de hemoglobina) es frecuente durante el embarazo. Para prevenirla, se administran hierro y vitaminas. Tambin se le realizarn exmenes para saber si tiene diabetes entre las 24 y las 28 semanas del embarazo. Podrn repetirle algunas de las pruebas que le hicieron previamente.  En cada visita le medirn el tamao del tero. Esto se realiza para asegurarse de que el beb est creciendo correctamente de acuerdo al estado del embarazo.  Tambin en cada visita prenatal controlarn su presin arterial. Esto se realiza  para asegurarse de que no tenga toxemia.  Se controlar su orina para asegurarse de que no tenga infecciones, diabetes o protena en la orina.  Se controlar su peso regularmente para asegurarse que el aumento ocurre al ritmo indicado. Esto se hace para asegurarse que usted y el beb tienen una evolucin normal.  En algunas ocasiones se realiza una prueba de ultrasonido para confirmar el correcto desarrollo y evolucin del beb. Esta prueba se realiza con ondas sonoras inofensivas para el beb, de modo que el profesional pueda calcular ms precisamente la fecha del parto. Algunas veces se realizan pruebas especializadas del lquido amnitico que rodea al beb. Esta prueba se denomina amniocentesis. El lquido amnitico se obtiene introduciendo una aguja en el vientre (abdomen). Se realiza para controlar los cromosomas en aquellos casos en los que existe alguna preocupacin acerca de algn problema gentico que pueda sufrir el beb. En ocasiones se lleva a cabo cerca del final del embarazo, si es necesario inducir al parto. En este caso se realiza para asegurarse que los pulmones del beb estn lo suficientemente maduros como para que pueda vivir fuera del tero. CAMBIOS QUE OCURREN EN EL SEGUNDO TRIMESTRE DEL EMBARAZO Su organismo atravesar numerosos cambios durante el embarazo. Estos pueden variar de una persona a otra. Converse con el profesional que la asiste acerca los cambios que usted note y que la preocupen.  Durante el segundo trimestre probablemente sienta un aumento del apetito. Es normal tener "antojos" de ciertas comidas. Esto vara de una persona a otra y de un embarazo a otro.  El abdomen inferior comenzar a abultarse.  Podr tener la necesidad de orinar con ms frecuencia debido a   que el tero y el beb presionan sobre la vejiga. Tambin es frecuente contraer ms infecciones urinarias durante el embarazo. Puede evitarlas bebiendo gran cantidad de lquidos y vaciando la vejiga antes y  despus de mantener relaciones sexuales.  Podrn aparecer las primeras estras en las caderas, abdomen y mamas. Estos son cambios normales del cuerpo durante el embarazo. No existen medicamentos ni ejercicios que puedan prevenir estos cambios.  Es posible que comience a desarrollar venas inflamadas y abultadas (varices) en las piernas. El uso de medias de descanso, elevar sus pies durante 15 minutos, 3 a 4 veces al da y limitar la sal en su dieta ayuda a aliviar el problema.  Podr sentir acidez gstrica a medida que el tero crece y presiona contra el estmago. Puede tomar anticidos, con la autorizacin de su mdico, para aliviar este problema. Tambin es til ingerir pequeas comidas 4 a 5 veces al da.  La constipacin puede tratarse con un laxante o agregando fibra a su dieta. Beber grandes cantidades de lquidos, comer vegetales, frutas y granos integrales es de gran ayuda.  Tambin es beneficioso practicar actividad fsica. Si ha sido una persona activa hasta el embarazo, podr continuar con la mayora de las actividades durante el mismo. Si ha sido menos activa, puede ser beneficioso que comience con un programa de ejercicios, como realizar caminatas.  Puede desarrollar hemorroides hacia el final del segundo trimestre. Tomar baos de asiento tibios y utilizar cremas recomendadas por el profesional que lo asiste sern de ayuda para los problemas de hemorroides.  Tambin podr sentir dolor de espalda durante este momento de su embarazo. Evite levantar objetos pesados, utilice zapatos de taco bajo y mantenga una buena postura para ayudar a reducir los problemas de espalda.  Algunas mujeres embarazadas desarrollan hormigueo y adormecimiento de la mano y los dedos debido a la hinchazn y compresin de los ligamentos de la mueca (sndrome del tnel carpiano). Esto desaparece una vez que el beb nace.  Como sus pechos se agrandan, necesitar un sujetador ms grande. Use un sostn de soporte,  cmodo y de algodn. No utilice un sostn para amamantar hasta el ltimo mes de embarazo si va a amamantar al beb.  Podr observar una lnea oscura desde el ombligo hacia la zona pbica denominada linea nigra.  Podr observar que sus mejillas se ponen coloradas debido al aumento de flujo sanguneo en la cara.  Podr desarrollar "araitas" en la cara, cuello y pecho. Esto desaparece una vez que el beb nace. INSTRUCCIONES PARA EL CUIDADO DOMICILIARIO  Es extremadamente importante que evite el cigarrillo, hierbas medicinales, alcohol y las drogas no prescriptas durante el embarazo. Estas sustancias qumicas afectan la formacin y el desarrollo del beb. Evite estas sustancias durante todo el embarazo para asegurar el nacimiento de un beb sano.  La mayor parte de los cuidados que se aconsejan son los mismos que los indicados para el primer trimestre del embarazo. Cumpla con las citas tal como se le indic. Siga las instrucciones del profesional que lo asiste con respecto al uso de los medicamentos, el ejercicio y la dieta.  Durante el embarazo debe obtener nutrientes para usted y para su beb. Consuma alimentos balanceados a intervalos regulares. Elija alimentos como carne, pescado, leche y otros productos lcteos descremados, vegetales, frutas, panes integrales y cereales. El profesional le informar cul es el aumento de peso ideal.  Las relaciones sexuales fsicas pueden continuarse hasta cerca del fin del embarazo si no existen otros problemas. Estos problemas pueden ser la prdida temprana (  prematura) de lquido amnitico de las membranas, sangrado vaginal, dolor abdominal u otros problemas mdicos o del embarazo.  Realice actividad fsica todos los das, si no tiene restricciones. Consulte con el profesional que la asiste si no sabe con certeza si determinados ejercicios son seguros. El mayor aumento de peso tiene lugar durante los ltimos 2 trimestres del embarazo. El ejercicio la ayudar  a:  Controlar su peso.  Ponerla en forma para el parto.  Ayudarla a perder peso luego de haber dado a luz.  Use un buen sostn o como los que se usan para hacer deportes para aliviar la sensibilidad de las mamas. Tambin puede serle til si lo usa mientras duerme. Si pierde calostro, podr utilizar apsitos en el sostn.  No utilice la baera con agua caliente, baos turcos y saunas durante el embarazo.  Utilice el cinturn de seguridad sin excepcin cuando conduzca. Este la proteger a usted y al beb en caso de accidente.  Evite comer carne cruda, queso crudo, y el contacto con los utensilios y desperdicios de los gatos. Estos elementos contienen grmenes que pueden causar defectos de nacimiento en el beb.  El segundo trimestre es un buen momento para visitar a su dentista y evaluar su salud dental si an no lo ha hecho. Es importante mantener los dientes limpios. Utilice un cepillo de dientes blando. Cepllese ms suavemente durante el embarazo.  Es ms fcil perder algo de orina durante el embarazo. Apretar y fortalecer los msculos de la pelvis la ayudar con este problema. Practique detener la miccin cuando est en el bao. Estos son los mismos msculos que necesita fortalecer. Son tambin los mismos msculos que utiliza cuando trata de evitar los gases. Puede practicar apretando estos msculos 10 veces, y repetir esto 3 veces por da aproximadamente. Una vez que conozca qu msculos debe apretar, no realice estos ejercicios durante la miccin. Puede favorecerle una infeccin si la orina vuelve hacia atrs.  Pida ayuda si tiene necesidades econmicas, de asesoramiento o nutricionales durante el embarazo. El profesional podr ayudarla con respecto a estas necesidades, o derivarla a otros especialistas.  La piel puede ponerse grasa. Si esto sucede, lvese la cara con un jabn suave, utilice un humectante no graso y maquillaje con base de aceite o crema. CONSUMO DE MEDICAMENTOS Y DROGAS  DURANTE EL EMBARAZO  Contine tomando las vitaminas apropiadas para esta etapa tal como se le indic. Las vitaminas deben contener un miligramo de cido flico y deben suplementarse con hierro. Guarde todas las vitaminas fuera del alcance de los nios. La ingestin de slo un par de vitaminas o tabletas que contengan hierro puede ocasionar la muerte en un beb o en un nio pequeo.  Evite el uso de medicamentos, inclusive los de venta libre y hierbas que no hayan sido prescriptos o indicados por el profesional que la asiste. Algunos medicamentos pueden causar problemas fsicos al beb. Utilice los medicamentos de venta libre o de prescripcin para el dolor, el malestar o la fiebre, segn se lo indique el profesional que lo asiste. No utilice aspirina.  El consumo de alcohol est relacionado con ciertos defectos de nacimiento. Esto incluye el sndrome de alcoholismo fetal. Debe evitar el consumo de alcohol en cualquiera de sus formas. El cigarrillo causa nacimientos prematuros y bebs de bajo peso. El uso de drogas recreativas est absolutamente prohibido. Son muy nocivas para el beb. Un beb que nace de una madre adicta, ser adicto al nacer. Ese beb tendr los mismos sntomas de abstinencia que un adulto.    Infrmele al profesional si consume alguna droga.  No consuma drogas ilegales. Pueden causarle mucho dao al beb. SOLICITE ATENCIN MDICA SI: Tiene preguntas o preocupaciones durante su embarazo. Es mejor que llame para consultar las dudas que esperar hasta su prxima visita prenatal. De esta forma se sentir ms tranquila.  SOLICITE ATENCIN MDICA DE INMEDIATO SI:  La temperatura oral se eleva sin motivo por encima de 102 F (38.9 C) o segn le indique el profesional que lo asiste.  Tiene una prdida de lquido por la vagina (canal de parto). Si sospecha una ruptura de las membranas, tmese la temperatura y llame al profesional para informarlo sobre esto.  Observa unas pequeas manchas,  una hemorragia vaginal o elimina cogulos. Notifique al profesional acerca de la cantidad y de cuntos apsitos est utilizando. Unas pequeas manchas de sangre son algo comn durante el embarazo, especialmente despus de mantener relaciones sexuales.  Presenta un olor desagradable en la secrecin vaginal y observa un cambio en el color, de transparente a blanco.  Contina con las nuseas y no obtiene alivio de los remedios indicados. Vomita sangre o algo similar a la borra del caf.  [place] o sube ms de 900 g. en una semana, o segn lo indicado por el profesional que la asiste.  Observa que se le hinchan el rostro, las manos, los pies o las piernas.  Ha estado expuesta a la rubola y no ha sufrido la enfermedad.  Ha estado expuesta a la quinta enfermedad o a la varicela.  Presenta dolor abdominal. Las molestias en el ligamento redondo son una causa no cancerosa (benigna) frecuente de dolor abdominal durante el embarazo. El profesional que la asiste deber evaluarla.  Presenta dolor de cabeza intenso que no se alivia.  Presenta fiebre, diarrea, dolor al orinar o le falta la respiracin.  Presenta dificultad para ver, visin borrosa, o visin doble.  Sufre una cada, un accidente de trnsito o cualquier tipo de trauma.  Vive en un hogar en el que existe violencia fsica o mental. Document Released: 09/29/2004 Document Revised: 09/14/2011 [employer] Patient Information 2014 [employer].  

## 2012-07-12 NOTE — Progress Notes (Signed)
Declines 17 P. Awaiting Harmony result. [place] nl last week

## 2012-07-13 ENCOUNTER — Telehealth (HOSPITAL_COMMUNITY): Payer: Self-pay | Admitting: MS"

## 2012-07-13 NOTE — Telephone Encounter (Signed)
Left message via [facility] Spanish interpreter [phone] for patient to return call.   Monica Blake 07/13/2012 11:27 AM

## 2012-07-16 ENCOUNTER — Telehealth (HOSPITAL_COMMUNITY): Payer: Self-pay | Admitting: MS"

## 2012-07-16 NOTE — Telephone Encounter (Signed)
Left message for patient via [facility], [facility]enn[facility] Bible [phone]) that I was calling with "good news." Asked patient to return call.   Clydie Braun Sohan Potvin 07/16/2012 9:53 AM

## 2012-07-23 ENCOUNTER — Inpatient Hospital Stay (HOSPITAL_COMMUNITY)
Admission: AD | Admit: 2012-07-23 | Discharge: 2012-07-23 | Disposition: A | Discharge status: Home / Self Care | Payer: Medicaid Other | Source: Ambulatory Visit | Attending: Obstetrics & Gynecology | Admitting: Obstetrics & Gynecology

## 2012-07-23 ENCOUNTER — Encounter (HOSPITAL_COMMUNITY): Payer: Self-pay | Admitting: *Deleted

## 2012-07-23 DIAGNOSIS — R51 Headache: Secondary | ICD-10-CM | POA: Insufficient documentation

## 2012-07-23 DIAGNOSIS — J309 Allergic rhinitis, unspecified: Secondary | ICD-10-CM

## 2012-07-23 DIAGNOSIS — O99891 Other specified diseases and conditions complicating pregnancy: Secondary | ICD-10-CM | POA: Insufficient documentation

## 2012-07-23 DIAGNOSIS — J3489 Other specified disorders of nose and nasal sinuses: Secondary | ICD-10-CM | POA: Insufficient documentation

## 2012-07-23 MED ORDER — SALINE SPRAY 0.65 % NA SOLN: 1.0000 | NASAL | Status: DC | PRN

## 2012-07-23 MED ORDER — PSEUDOEPHEDRINE HCL 30 MG PO TABS: 30.0000 mg | ORAL_TABLET | ORAL | Status: DC | PRN

## 2012-07-23 MED ORDER — LORATADINE 10 MG PO TABS: 10.0000 mg | ORAL_TABLET | Freq: Every day | ORAL | Status: DC

## 2012-07-23 NOTE — MAU Note (Signed)
C/o head congestion (allergies) and headache for past 2 months;

## 2012-07-23 NOTE — MAU Provider Note (Signed)
  History     CSN: [id number]  Arrival date and time: 07/23/12 1336   None     Chief Complaint  Patient presents with  . Nasal Congestion  . Headache   HPI Patient is a 42 y.o. female presenting at [redacted]w[redacted]d with complaint of allergies.  She says she always suffers from seasonal allergies but yesterday her congestion got worse to the point that she could not sleep through the night.  Mucus was noted to be green and watery.  She also admits to a headache occuring in the nasal/orbital region of her face.  She has not tried anything for her symptoms.  States baby feels normal.  Denies any abdominal pain or other symptoms.     Past Medical History  Diagnosis Date  . Preterm labor     1998    Past Surgical History  Procedure Laterality Date  . Rectal surgery      Family History  Problem Relation Age of Onset  . Diabetes Father     History  Substance Use Topics  . Smoking status: Never Smoker   . Smokeless tobacco: Never Used  . Alcohol Use: No    Allergies: No Known Allergies  Prescriptions prior to admission  Medication Sig Dispense Refill  . budesonide (RHINOCORT AQUA) 32 MCG/ACT nasal spray Place 1 spray into the nose 2 (two) times daily.  1 Bottle  1  . flintstones complete (FLINTSTONES) 60 MG chewable tablet Chew 2 tablets by mouth daily.        Review of Systems  Constitutional: Positive for fever. Negative for chills and diaphoresis.  Eyes: Negative for blurred vision and double vision.  Respiratory: Negative for cough.   Neurological: Positive for dizziness and headaches.   Physical Exam   Blood pressure 97/61, pulse 83, temperature 97.1 F (36.2 C), resp. rate 18, last menstrual period 02/24/2012.  Physical Exam  Constitutional: She appears well-developed and well-nourished.  HENT:  Head: Normocephalic.  Cardiovascular: Normal rate and regular rhythm.   Respiratory: Effort normal.  Lymphadenopathy:    She has no cervical adenopathy.  Psychiatric:  She has a normal mood and affect. Her behavior is normal. Judgment normal.    MAU Course  Procedures: none  Assessment and Plan  Allergies:  -Claritin qday -pseudafed PRN -saline nasal sprays PRN   Roxan Hockey, Unique 07/23/2012, 2:29 PM   See also by me Is on Nasocort spray, not sure if using  Agree with note  Wynelle Bourgeois CNM

## 2012-07-24 ENCOUNTER — Telehealth (HOSPITAL_COMMUNITY): Payer: Self-pay | Admitting: MS"

## 2012-07-24 NOTE — Telephone Encounter (Signed)
Called Monica Blake to discuss her cell free fetal DNA test results.  Mrs. Monica Blake had Panorama testing through [facility].  Testing was offered because of maternal age.   The patient was identified by name and DOB.  We reviewed that these are within normal limits, showing a less than 1 in 10,000 risk for trisomies 21, 18 and 13, and monosomy X (Turner syndrome).  In addition, the risk for triploidy/vanishing twin and sex chromosome trisomies (47,XXX and 47,XXY) was also low risk.  We reviewed that this testing identifies > 99% of pregnancies with trisomy 20, trisomy 76, trisomy 29, sex chromosome trisomies (47,XXX and 47,XXY), and triploidy.  The detection rate for monosomy X is ~92%.  The false positive rate is <0.1% for all conditions. Testing was also consistent with female gender.  The patient did wish to know gender.  She understands that this testing does not identify all genetic conditions.  All questions were answered to her satisfaction, she was encouraged to call with additional questions or concerns.  Quinn Plowman, MS Certified Genetic Counselor 07/24/2012 12:07 PM

## 2012-08-09 ENCOUNTER — Encounter: Payer: Self-pay | Admitting: Obstetrics & Gynecology

## 2012-08-09 ENCOUNTER — Ambulatory Visit (INDEPENDENT_AMBULATORY_CARE_PROVIDER_SITE_OTHER): Payer: Medicaid Other | Admitting: Obstetrics & Gynecology

## 2012-08-09 VITALS — BP 92/61 | Temp 98.2°F | Wt 159.0 lb

## 2012-08-09 DIAGNOSIS — O0990 Supervision of high risk pregnancy, unspecified, unspecified trimester: Secondary | ICD-10-CM

## 2012-08-09 DIAGNOSIS — O09219 Supervision of pregnancy with history of pre-term labor, unspecified trimester: Secondary | ICD-10-CM

## 2012-08-09 LAB — POCT URINALYSIS DIP (DEVICE)
Bilirubin Urine: NEGATIVE
Ketones, ur: NEGATIVE mg/dL
Protein, ur: NEGATIVE mg/dL
Specific Gravity, Urine: 1.025 (ref 1.005–1.030)

## 2012-08-09 NOTE — Progress Notes (Signed)
Routine visit. Good FM. Harmony normal. No problems. Wants Mirena for contraception. Will need glucola, labs, tdap at next visit.

## 2012-08-09 NOTE — Progress Notes (Signed)
Pulse: 78

## 2012-08-30 ENCOUNTER — Ambulatory Visit (INDEPENDENT_AMBULATORY_CARE_PROVIDER_SITE_OTHER): Payer: Medicaid Other | Admitting: Obstetrics & Gynecology

## 2012-08-30 VITALS — BP 95/60 | Temp 96.7°F | Wt 165.0 lb

## 2012-08-30 DIAGNOSIS — O09219 Supervision of pregnancy with history of pre-term labor, unspecified trimester: Secondary | ICD-10-CM

## 2012-08-30 DIAGNOSIS — O09212 Supervision of pregnancy with history of pre-term labor, second trimester: Secondary | ICD-10-CM

## 2012-08-30 DIAGNOSIS — Z23 Encounter for immunization: Secondary | ICD-10-CM

## 2012-08-30 LAB — POCT URINALYSIS DIP (DEVICE)
Bilirubin Urine: NEGATIVE
Glucose, UA: NEGATIVE mg/dL
Ketones, ur: NEGATIVE mg/dL
Specific Gravity, Urine: 1.025 (ref 1.005–1.030)
Urobilinogen, UA: 0.2 mg/dL (ref 0.0–1.0)

## 2012-08-30 LAB — CBC
HCT: 30.3 % — ABNORMAL LOW (ref 36.0–46.0)
Hemoglobin: 10.7 g/dL — ABNORMAL LOW (ref 12.0–15.0)
RBC: 3.15 MIL/uL — ABNORMAL LOW (ref 3.87–5.11)
WBC: 6.9 10*3/uL (ref 4.0–10.5)

## 2012-08-30 MED ORDER — TETANUS-DIPHTH-ACELL PERTUSSIS 5-2.5-18.5 LF-MCG/0.5 IM SUSP
0.5000 mL | Freq: Once | INTRAMUSCULAR | Status: AC
  Administered 2012-08-30: 0.5 mL via INTRAMUSCULAR

## 2012-08-30 NOTE — Addendum Note (Signed)
Addended by: Franchot Mimes on: 08/30/2012 10:03 AM   Modules accepted: Orders

## 2012-08-30 NOTE — Progress Notes (Signed)
No complaints.  No ctx, LOF, VB.  GCT today.  Pt would like to come back in 3 weeks instead of 2.  PTL precautions given.

## 2012-08-30 NOTE — Progress Notes (Signed)
P=71,   [employer]

## 2012-08-31 LAB — GLUCOSE TOLERANCE, 1 HOUR (50G) W/O FASTING: Glucose, 1 Hour GTT: 57 mg/dL — ABNORMAL LOW (ref 70–140)

## 2012-08-31 LAB — RPR

## 2012-09-03 ENCOUNTER — Encounter: Payer: Self-pay | Admitting: Obstetrics & Gynecology

## 2012-09-19 ENCOUNTER — Encounter: Payer: Self-pay | Admitting: *Deleted

## 2012-09-20 ENCOUNTER — Encounter: Payer: Self-pay | Admitting: Family Medicine

## 2012-09-20 ENCOUNTER — Ambulatory Visit (INDEPENDENT_AMBULATORY_CARE_PROVIDER_SITE_OTHER): Payer: Medicaid Other | Admitting: Family Medicine

## 2012-09-20 VITALS — BP 100/63 | Temp 97.1°F | Wt 165.0 lb

## 2012-09-20 DIAGNOSIS — O09523 Supervision of elderly multigravida, third trimester: Secondary | ICD-10-CM

## 2012-09-20 DIAGNOSIS — O09529 Supervision of elderly multigravida, unspecified trimester: Secondary | ICD-10-CM

## 2012-09-20 LAB — POCT URINALYSIS DIP (DEVICE)
Bilirubin Urine: NEGATIVE
Glucose, UA: NEGATIVE mg/dL
Ketones, ur: NEGATIVE mg/dL
Specific Gravity, Urine: 1.02 (ref 1.005–1.030)

## 2012-09-20 NOTE — Progress Notes (Signed)
Pulse  69 C/o varicose veins on lower extremities and painful.

## 2012-09-20 NOTE — Patient Instructions (Signed)
Embarazo  Tercer trimestre  (Pregnancy - Third Trimester) El tercer trimestre del embarazo (los ltimos 3 meses) es el perodo en el cual tanto usted como su beb crecen con ms rapidez. El beb alcanza un largo de aproximadamente 50 cm. y pesa entre 2,700 y 4,500 kg. El beb gana ms tejido graso y est listo para la vida fuera del cuerpo de la madre. Mientras estn en el interior, los bebs tienen perodos de sueo y vigilia, succionan el pulgar y tienen hipo. Quizs sienta pequeas contracciones del tero. Este es el falso trabajo de parto. Tambin se las conoce como contracciones de Braxton-Hicks . Es como una prctica del parto. Los problemas ms habituales de esta etapa del embarazo incluyen mayor dificultad para respirar, hinchazn de las manos y los pies por retencin de lquidos y la necesidad de orinar con ms frecuencia debido a que el tero y el beb presionan sobre la vejiga.  EXAMENES PRENATALES   Durante los exmenes prenatales, deber seguir realizndose anlisis de sangre. Estas pruebas se realizan para controlar su salud y la del beb. Los anlisis de sangre se realizan para conocer los niveles de algunos compuestos de la sangre (hemoglobina). La anemia (bajo nivel de hemoglobina) es frecuente durante el embarazo. Para prevenirla, se administran hierro y vitaminas. Tambin le tomarn nuevas anlisis para descartar diabetes. Podrn repetirle algunas de las pruebas que le hicieron previamente.  En cada visita le medirn el tamao del tero. Esto permite asegurar que el beb se desarrolla adecuadamente, segn la fecha del embarazo.  Le controlarn la presin arterial en cada visita prenatal. Esto es para asegurarse de que no sufre toxemia.  Le harn un anlisis de orina en cada visita prenatal, para descartar infecciones, diabetes y la presencia de protenas.  Tambin en cada visita controlarn su peso. Esto se realiza para asegurarse que aumenta de peso al ritmo indicado y que usted y  su beb evolucionan normalmente.  En algunas ocasiones se realiza una prueba de ultrasonido para confirmar el correcto desarrollo y evolucin del beb. Esta prueba se realiza con ondas sonoras inofensivas para el beb, de modo que el profesional pueda calcular ms precisamente la fecha del parto.  Analice con su mdico los analgsicos y la anestesia que recibir durante el trabajo de parto y el parto.  Comente la posibilidad de que necesite una cesrea y qu anestesia se recibir.  Informe a su mdico si sufre violencia familiar mental o fsica. A veces, se indica la prueba especializada sin estrs, la prueba de tolerancia a las contracciones y el perfil biofsico para asegurarse de que el beb no tiene problemas. El estudio del lquido amnitico que rodea al beb se llama amniocentesis. El lquido amnitico se obtiene introduciendo una aguja en el vientre (abdomen ). En ocasiones se lleva a cabo cerca del final del embarazo, si es necesario inducir a un parto. En este caso se realiza para asegurarse que los pulmones del beb estn lo suficientemente maduros como para que pueda vivir fuera del tero. Si los pulmones no han madurado y es peligroso que el beb nazca, se administrar a la madre una inyeccin de cortisona , 1 a 2 das antes del parto. . Esto ayuda a que los pulmones del beb maduren y sea ms seguro su nacimiento.  CAMBIOS QUE OCURREN EN EL TERCER TRIMESTRE DEL EMBARAZO  Su organismo atravesar numerosos cambios durante el embarazo. Estos pueden variar de una persona a otra. Converse con el profesional que la asiste acerca los cambios que   usted note y que la preocupen.   Durante el ltimo trimestre probablemente sienta un aumento del apetito. Es normal tener "antojos" de ciertas comidas. Esto vara de una persona a otra y de un embarazo a otro.  Podrn aparecer las primeras estras en las caderas, abdomen y mamas. Estos son cambios normales del cuerpo durante el embarazo. No existen  medicamentos ni ejercicios que puedan prevenir estos cambios.  La constipacin puede tratarse con un laxante o agregando fibra a su dieta. Beber grandes cantidades de lquidos, tomar fibras en forma de vegetales, frutas y granos integrales es de gran ayuda.  Tambin es beneficioso practicar actividad fsica. Si ha sido una persona activa hasta el embarazo, podr continuar con la mayora de las actividades durante el mismo. Si ha sido menos activa, puede ser beneficioso que comience con un programa de ejercicios, como realizar caminatas. Consulte con el profesional que la asiste antes de comenzar un programa de ejercicios.  Evite el consumo de cigarrillos, el alcohol, los medicamentos no recetados y las "drogas de la calle" durante el embarazo. Estas sustancias qumicas afectan la formacin y el desarrollo del beb. Evite estas sustancias durante todo el embarazo para asegurar el nacimiento de un beb sano.  Podr sentir dolor de espalda, tener vrices en las venas y hemorroides, o si ya los sufra, pueden empeorar.  Durante el tercer trimestre se cansar con ms facilidad, lo cual es normal.  Los movimientos del beb pueden ser ms fuertes y con ms frecuencia.  Puede que note dificultades para respirar normalmente.  El ombligo puede salir hacia afuera.  A veces sale una secrecin amarilla de las mamas, que se llama calostro.  Podr aparecer una secrecin mucosa con sangre. Esto suele ocurrir entre unos pocos das y una semana antes del parto. INSTRUCCIONES PARA EL CUIDADO EN EL HOGAR   Cumpla con las citas de control. Siga las indicaciones del mdico con respecto al uso de medicamentos, los ejercicios y la dieta.  Durante el embarazo debe obtener nutrientes para usted y para su beb. Consuma alimentos balanceados a intervalos regulares. Elija alimentos como carne, pescado, leche y otros productos lcteos descremados, vegetales, frutas, panes integrales y cereales. El mdico le informar  cul es el aumento de peso ideal.  Las relaciones sexuales pueden continuarse hasta casi el final del embarazo, si no se presentan otros problemas como prdida prematura (antes de tiempo) de lquido amnitico, hemorragia vaginal o dolor en el vientre (abdominal).  Realice actividad fsica todos los das, si no tiene restricciones. Consulte con el profesional que la asiste si no sabe con certeza si determinados ejercicios son seguros. El mayor aumento de peso se producir en los ltimos 2 trimestres del embarazo. El ejercicio ayuda a:  Controlar su peso.  Mantenerse en forma para el trabajo de parto y el parto .  Perder peso despus del parto.  Haga reposo con frecuencia, con las piernas elevadas, o segn lo necesite para evitar los calambres y el dolor de cintura.  Use un buen sostn o como los que se usan para hacer deportes para aliviar la sensibilidad de las mamas. Tambin puede serle til si lo usa mientras duerme. Si pierde calostro, podr utilizar apsitos en el sostn.  No utilice la baera con agua caliente, baos turcos y saunas.   Colquese el cinturn de seguridad cuando conduzca. Este la proteger a usted y al beb en caso de accidente.  Evite comer carne cruda y el contacto con los utensilios y desperdicios de los gatos. Estos   elementos contienen grmenes que pueden causar defectos de nacimiento en el beb.  Es fcil perder algo de orina durante el embarazo. Apretar y fortalecer los msculos de la pelvis la ayudar con este problema. Practique detener la miccin cuando est en el bao. Estos son los mismos msculos que necesita fortalecer. Son tambin los mismos msculos que utiliza cuando trata de evitar despedir gases. Puede practicar apretando estos msculos diez veces, y repetir esto tres veces por da aproximadamente. Una vez que conozca qu msculos debe apretar, no realice estos ejercicios durante la miccin. Puede favorecerle una infeccin si la orina vuelve hacia  atrs.  Pida ayuda si tienen necesidades financieras, teraputicas o nutricionales. El profesional podr ayudarla con respecto a estas necesidades, o derivarla a otros especialistas.  Haga una lista de nmeros telefnicos de emergencia y tngalos disponibles.  Planifique como obtener ayuda de familiares o amigos cuando regrese a casa desde el hospital.  Hacer un ensayo sobre la partida al hospital.  Tome clases prenatales con el padre para entender, practicar y hacer preguntas sobre el trabajo de parto y el alumbramiento.  Preparar la habitacin del beb / busque una guardera.  No viaje fuera de la ciudad a menos que sea absolutamente necesario y con el asesoramiento de su mdico.  Use slo zapatos de tacn bajo o sin tacn para tener mejor equilibrio y evitar cadas. USO DE MEDICAMENTOS Y CONSUMO DE DROGAS DURANTE EL EMBARAZO   Tome las vitaminas apropiadas para esta etapa tal como se le indic. Las vitaminas deben contener un miligramo de cido flico. Guarde todas las vitaminas fuera del alcance de los nios. La ingestin de slo un par de vitaminas o tabletas que contengan hierro pueden ocasionar la muerte en un beb o en un nio pequeo.  Evite el uso de todos los medicamentos, incluyendo hierbas, medicamentos de venta libre, sin receta o que no hayan sido sugeridos por su mdico. Slo tome medicamentos de venta libre o medicamentos recetados para el dolor, el malestar o fiebre como lo indique su mdico. No tome aspirina, ibuprofeno o naproxeno excepto que su mdico se lo indique.  Infrmele al profesional si consume alguna droga.  El alcohol se relaciona con ciertos defectos congnitos. Incluye el sndrome de alcoholismo fetal. Debe evitar absolutamente el consumo de alcohol, en cualquier forma. El fumar produce baja tasa de natalidad y bebs prematuros.  Las drogas ilegales o de la calle son muy perjudiciales para el beb. Estn absolutamente prohibidas. Un beb que nace de una  madre adicta, ser adicto al nacer. Ese beb tendr los mismos sntomas de abstinencia que un adulto. SOLICITE ATENCIN MDICA SI:  Tiene preguntas o preocupaciones relacionadas con el embarazo. Es mejor que llame para formular las preguntas si no puede esperar hasta la prxima visita, que sentirse preocupada por ellas.  SOLICITE ATENCIN MDICA DE INMEDIATO SI:   La temperatura oral le sube a ms de 38,9 C (102 F) o lo que su mdico le indique.  Tiene una prdida de lquido por la vagina (canal de parto). Si sospecha una ruptura de las membranas, tmese la temperatura y llame al profesional para informarlo sobre esto.  Observa unas pequeas manchas, una hemorragia vaginal o elimina cogulos. Notifique al profesional acerca de la cantidad y de cuntos apsitos est utilizando.  Presenta un olor desagradable en la secrecin vaginal y observa un cambio en el color, de transparente a blanco.  Ha vomitado durante ms de 24 horas.  Siente escalofros o le sube la fiebre.  Le   falta el aire.  Siente ardor al orinar.  Baja o sube ms de 2 libras (900 g), o segn lo indicado por el profesional que la asiste.  Observa que sbitamente se le hinchan el rostro, las manos, los pies o las piernas.  Siente dolor en el vientre (abdominal). Las molestias en el ligamento redondo son una causa benigna frecuente de dolor abdominal durante el embarazo. El profesional que la asiste deber evaluarla.  Presenta dolor de cabeza intenso que no se alivia.  Tiene problemas visuales, visin doble o borrosa.  Si no siente los movimientos del beb durante ms de 1 hora. Si piensa que el beb no se mueve tanto como lo haca habitualmente, coma algo que contenga azcar y recustese sobre el lado izquierdo durante una hora. El beb debe moverse al menos 4  5 veces por hora. Comunquese inmediatamente si el beb se mueve menos que lo indicado.  Se cae, se ve involucrada en un accidente automovilstico o sufre algn  tipo de traumatismo.  En su hogar hay violencia mental o fsica. Document Released: 09/29/2004 Document Revised: 09/14/2011 [employer] Patient Information 2014 [employer]. Place 32-42 weeks prenatal visit patient instructions here.  

## 2012-09-20 NOTE — Progress Notes (Signed)
  Subjective:    Monica Blake is a 42 y.o. female being seen today for her obstetrical visit. She is at [redacted]w[redacted]d gestation. Patient reports no bleeding, no contractions, no cramping, no leaking and complains of vericaose veins. Fetal movement: normal.  Menstrual History: OB History   Grav Para Term Preterm Abortions TAB SAB Ect Mult Living   4 3 2 1      3        Patient's last menstrual period was 02/24/2012.    The following portions of the patient's history were reviewed and updated as appropriate: allergies, current medications, past family history, past medical history, past social history, past surgical history and problem list.  Review of Systems Pertinent items are noted in HPI.   Objective:    BP 100/63  Temp(Src) 97.1 F (36.2 C)  Wt 74.844 kg (165 lb)  BMI 25.84 kg/m2  LMP 02/24/2012 FHT:  124 BPM  Uterine Size: 28 cm and size equals dates  Presentation:      Assessment:   Monica Blake is a 42 y.o. [phone] at [redacted]w[redacted]d here for ROB visit.  Discussed with Patient:  -Plans to breast feed.  All questions answered. -Continue prenatal vitamins. -  Neg harmony  -Reviewed fetal kick counts (Pt to perform daily at a time when the baby is active, lie laterally with both hands on belly in quiet room and count all movements (hiccups, shoulder rolls, obvious kicks, etc); pt is to report to clinic or MAU for less than 10 movements felt in a one hour time period-pt told as soon as she counts 10 movements the count is complete.)  - Routine precautions discussed (depression, infection s/s).   Patient provided with all pertinent phone numbers for emergencies. - RTC for any VB, regular, painful cramps/ctxs occurring at a rate of >2/10 min, fever (100.5 or higher), n/v/d, any pain that is unresolving or worsening, LOF, decreased fetal movement, CP, SOB, edema  Problems: Patient Active Problem List   Diagnosis Date Noted  . Supervision of high-risk pregnancy 06/18/2012  . AMA  (advanced maternal age) multigravida 35+ 06/04/2012  . Pregnancy complicated by previous preterm labor 06/04/2012  . Asthma 06/04/2012    To Do: F/u 2wk  [ ]  Vaccines: Flu:  Tdap: recd [ ]  BCM:  IUD  Edu: [x]  PTL precautions; [ ]  BF class; [ ]  childbirth class; [ ]   BF counseling;

## 2012-10-04 ENCOUNTER — Ambulatory Visit (INDEPENDENT_AMBULATORY_CARE_PROVIDER_SITE_OTHER): Payer: Medicaid Other | Admitting: Advanced Practice Midwife

## 2012-10-04 VITALS — BP 98/67 | Temp 96.7°F | Wt 168.1 lb

## 2012-10-04 DIAGNOSIS — Z23 Encounter for immunization: Secondary | ICD-10-CM

## 2012-10-04 DIAGNOSIS — O09529 Supervision of elderly multigravida, unspecified trimester: Secondary | ICD-10-CM

## 2012-10-04 LAB — POCT URINALYSIS DIP (DEVICE)
Bilirubin Urine: NEGATIVE
Glucose, UA: NEGATIVE mg/dL
Ketones, ur: NEGATIVE mg/dL
pH: 7 (ref 5.0–8.0)

## 2012-10-04 MED ORDER — FERROUS SULFATE 325 (65 FE) MG PO TABS: 325.0000 mg | ORAL_TABLET | Freq: Every day | ORAL | Status: DC

## 2012-10-04 MED ORDER — INFLUENZA VIRUS VACC SPLIT PF IM SUSP: 0.5000 mL | Freq: Once | INTRAMUSCULAR | Status: DC

## 2012-10-04 NOTE — Progress Notes (Signed)
P = 77 

## 2012-10-04 NOTE — Progress Notes (Signed)
Doing well.  Good fetal movement, denies vaginal bleeding, LOF, regular contractions.  

## 2012-10-11 ENCOUNTER — Encounter: Payer: Medicaid Other | Admitting: Obstetrics & Gynecology

## 2012-10-18 ENCOUNTER — Encounter: Payer: Self-pay | Admitting: Family Medicine

## 2012-10-18 ENCOUNTER — Ambulatory Visit (INDEPENDENT_AMBULATORY_CARE_PROVIDER_SITE_OTHER): Payer: Medicaid Other | Admitting: Family Medicine

## 2012-10-18 VITALS — BP 105/60 | Temp 98.2°F | Wt 168.0 lb

## 2012-10-18 DIAGNOSIS — O0993 Supervision of high risk pregnancy, unspecified, third trimester: Secondary | ICD-10-CM

## 2012-10-18 DIAGNOSIS — O09213 Supervision of pregnancy with history of pre-term labor, third trimester: Secondary | ICD-10-CM

## 2012-10-18 DIAGNOSIS — O09219 Supervision of pregnancy with history of pre-term labor, unspecified trimester: Secondary | ICD-10-CM

## 2012-10-18 DIAGNOSIS — J45909 Unspecified asthma, uncomplicated: Secondary | ICD-10-CM

## 2012-10-18 DIAGNOSIS — O09529 Supervision of elderly multigravida, unspecified trimester: Secondary | ICD-10-CM

## 2012-10-18 DIAGNOSIS — O09523 Supervision of elderly multigravida, third trimester: Secondary | ICD-10-CM

## 2012-10-18 LAB — POCT URINALYSIS DIP (DEVICE)
Bilirubin Urine: NEGATIVE
Nitrite: NEGATIVE
Protein, ur: NEGATIVE mg/dL
pH: 6.5 (ref 5.0–8.0)

## 2012-10-18 MED ORDER — GUAIFENESIN ER 600 MG PO TB12: 1200.0000 mg | ORAL_TABLET | Freq: Two times a day (BID) | ORAL | Status: DC

## 2012-10-18 MED ORDER — LORATADINE 10 MG PO TABS: 10.0000 mg | ORAL_TABLET | Freq: Every day | ORAL | Status: DC

## 2012-10-18 NOTE — Patient Instructions (Addendum)
Embarazo  Systems analyst trimestre  (Pregnancy - Third Trimester) El tercer trimestre del Psychiatrist (los ltimos 3 meses) es el perodo en el cual tanto usted como su beb crecen con ms rapidez. El beb alcanza un largo de aproximadamente 50 cm. y pesa entre 2,700 y 4,500 kg. El beb gana ms tejido graso y est listo para la vida fuera del cuerpo de la [place]. Mientras estn en el interior, los bebs tienen perodos de sueo y vigilia, Warehouse manager y tienen hipo. Quizs sienta pequeas contracciones del tero. Este es el falso trabajo de [place]. Tambin se las conoce como contracciones de Braxton[place] . Es como una prctica del parto. Los problemas ms habituales de esta etapa del embarazo incluyen mayor dificultad para respirar, hinchazn de las manos y los pies por retencin de lquidos y la necesidad de Geographical information systems officer con ms frecuencia debido a que el tero y el beb presionan sobre la vejiga.  EXAMENES PRENATALES   Durante los Manpower Inc, deber seguir realizndose anlisis de [place]. Estas pruebas se realizan para controlar su salud y la del beb. Los ARAMARK Corporation de sangre se Radiographer, therapeutic para [employer] Mutual niveles de algunos compuestos de la sangre (hemoglobina). La anemia (bajo nivel de hemoglobina) es frecuente durante el embarazo. Para prevenirla, se administran hierro y vitaminas. Tambin le tomarn nuevas anlisis para descartar diabetes. Podrn repetirle algunas de las Hovnanian Enterprises hicieron previamente.  En cada visita le medirn el tamao del tero. Esto permite asegurar que el beb se desarrolla adecuadamente, segn la fecha del embarazo.  Le controlarn la presin arterial en cada visita prenatal. Esto es para asegurarse de que no sufre toxemia.  Le harn un anlisis de orina en cada visita prenatal, para descartar infecciones, diabetes y la presencia de protenas.  Tambin en cada visita controlarn su peso. Esto se realiza para asegurarse que aumenta de peso al ritmo indicado y que usted y  su beb evolucionan normalmente.  En algunas ocasiones se realiza una prueba de ultrasonido para confirmar el correcto desarrollo y evolucin del beb. Esta prueba se realiza con ondas sonoras inofensivas para el beb, de modo que el profesional pueda calcular ms precisamente la fecha del [place].  Analice con su mdico los analgsicos y la anestesia que recibir durante el [place] de parto y [place].  Comente la posibilidad de que necesite una cesrea y qu anestesia se recibir.  Informe a su mdico si sufre violencia familiar mental o fsica. A veces, se indica la prueba especializada sin estrs, la prueba de tolerancia a las contracciones y el perfil biofsico para asegurarse de que el beb no tiene problemas. El estudio del lquido amnitico que rodea al beb se llama amniocentesis. El lquido amnitico se obtiene introduciendo una aguja en el vientre (abdomen ). En ocasiones se lleva a cabo cerca del final del embarazo, si es necesario inducir a un parto. En este caso se realiza para asegurarse que los pulmones del beb estn lo suficientemente maduros como para que pueda vivir fuera del tero. Si los pulmones no han madurado y es peligroso que el beb nazca, se Building services engineer a la madre una inyeccin de [place] , 1 a 2 [address]  [address] antes del [address]. Vivia Budge ayuda a que los pulmones del beb maduren y sea ms seguro su nacimiento.  CAMBIOS QUE OCURREN EN EL TERCER TRIMESTRE DEL EMBARAZO  Su organismo atravesar numerosos cambios durante el [place]. Estos pueden variar de Neomia Dear persona a otra. Converse con el profesional que la asiste acerca los cambios que  usted note y que la preocupen.   Durante el ltimo trimestre probablemente sienta un aumento del apetito. Es normal tener "antojos" de Development worker, community. Esto vara de Neomia Dear persona a otra y de un embarazo a Therapist, art.  Podrn aparecer las primeras estras en las caderas, abdomen y [place]. Estos son cambios normales del cuerpo durante el [place]. No existen  medicamentos ni ejercicios que puedan prevenir CarMax.  La constipacin puede tratarse con un laxante o agregando fibra a su dieta. Beber grandes cantidades de lquidos, tomar fibras en forma de vegetales, frutas y granos integrales es de gran [place].  Tambin es beneficioso practicar actividad fsica. Si ha sido una persona Engineer, mining, podr continuar con la Harley-Davidson de las actividades durante el mismo. Si ha sido [employer] Insurance, puede ser beneficioso que comience con un programa de ejercicios, Museum/gallery exhibitions officer. Consulte con el profesional que la asiste antes de comenzar un programa de ejercicios.  Evite el consumo de cigarrillos, el alcohol, los medicamentos no recetados y las "drogas de la calle" durante el Psychiatrist. Estas sustancias qumicas afectan la formacin y el desarrollo del beb. Evite estas sustancias durante todo el embarazo para asegurar el nacimiento de un beb sano.  Podr sentir dolor de espalda, tener vrices en las venas y hemorroides, o si ya los sufra, pueden [place].  Durante el tercer trimestre se cansar con ms facilidad, lo cual es normal.  Los movimientos del beb pueden ser ms fuertes y con ms frecuencia.  Puede que note dificultades para respirar normalmente.  El ombligo puede salir hacia afuera.  A veces sale Veterinary surgeon de las [place], que se llama Product manager.  Podr aparecer Neomia Dear secrecin mucosa con sangre. Esto suele ocurrir General Electric unos [address] y Neomia Dear semana antes del [place]. INSTRUCCIONES PARA EL CUIDADO EN EL HOGAR   Cumpla con las citas de control. Siga las indicaciones del mdico con respecto al uso de [place], los ejercicios y la dieta.  Durante el embarazo debe obtener nutrientes para usted y para su beb. Consuma alimentos balanceados a intervalos regulares. Elija alimentos como carne, pescado, [place] y otros productos lcteos descremados, vegetales, frutas, panes integrales y cereales. El Office Depot informar  cul es el aumento de peso ideal.  Las relaciones sexuales pueden continuarse hasta casi el final del embarazo, si no se presentan otros problemas como prdida prematura (antes de [place]) de lquido amnitico, hemorragia vaginal o dolor en el vientre (abdominal).  Realice Tesoro Corporation, si no tiene restricciones. Consulte con el profesional que la asiste si no sabe con certeza si determinados ejercicios son seguros. El mayor aumento de peso se producir en los ltimos 2 trimestres del Psychiatrist. El ejercicio ayuda a:  Engineering geologist.  Mantenerse en forma para el trabajo de parto y [place] .  Perder peso despus del parto.  Haga reposo con frecuencia, con las piernas elevadas, o segn lo necesite para evitar los calambres y el dolor de cintura.  Use un buen sostn o como los que se usan para hacer deportes para Paramedic la sensibilidad de las [place]. Tambin puede serle til si lo [place] mientras duerme. Si pierde Product manager, podr Parker Hannifin.  No utilice la baera con agua caliente, baos turcos y saunas.   Colquese el cinturn de seguridad cuando conduzca. Este la proteger a usted y al beb en caso de accidente.  Evite comer carne cruda y el contacto con los utensilios y desperdicios de los gatos. Estos  elementos contienen grmenes que pueden causar defectos de nacimiento en el beb.  Es fcil perder algo de orina durante el [place]. Apretar y Chief Operating Officer los msculos de la pelvis la ayudar con este problema. Practique detener la miccin cuando est en el bao. Estos son los mismos msculos que Development worker, international aid. Son TEPP[employer] Partners mismos msculos que utiliza cuando trata de evitar despedir gases. Puede practicar apretando estos msculos WellPoint, y repetir esto tres veces por da aproximadamente. Una vez que conozca qu msculos debe apretar, no realice estos ejercicios durante la miccin. Puede favorecerle una infeccin si la orina vuelve hacia  atrs.  Pida ayuda si tienen necesidades financieras, teraputicas o nutricionales. El profesional podr ayudarla con respecto a estas necesidades, o derivarla a otros especialistas.  Haga una lista de nmeros telefnicos de emergencia y tngalos disponibles.  Planifique como obtener ayuda de familiares o amigos cuando regrese a Programmer, applications hospital.  Hacer un ensayo sobre la partida al hospital.  [place] clases prenatales con el padre para entender, practicar y hacer preguntas sobre el [place] de parto y el alumbramiento.  Preparar la habitacin del beb / busque Fatima Blank.  No viaje fuera de la ciudad a menos que sea absolutamente necesario y con el asesoramiento de su mdico.  Use slo zapatos de tacn bajo o sin tacn para tener mejor equilibrio y Automotive engineer cadas. USO DE MEDICAMENTOS Y CONSUMO DE DROGAS DURANTE EL [facility]   Tome las vitaminas apropiadas para esta etapa tal como se le indic. Las vitaminas deben contener un miligramo de cido flico. Guarde todas las vitaminas fuera del alcance de los nios. La ingestin de slo un par de vitaminas o tabletas que contengan hierro pueden ocasionar la Newmont Mining en un beb o en un nio pequeo.  Evite el uso de The Mutual of Omaha, incluyendo hierbas, medicamentos de [place], sin receta o que no hayan sido sugeridos por su mdico. Slo tome medicamentos de venta libre o medicamentos recetados para Chief Technology Officer, Environmental health practitioner o fiebre como lo indique su mdico. No tome aspirina, ibuprofeno o naproxeno excepto que su mdico se lo indique.  Infrmele al profesional si consume alguna droga.  El alcohol se relaciona con ciertos defectos congnitos. Incluye el sndrome de alcoholismo fetal. Debe evitar absolutamente el consumo de alcohol, en cualquier forma. El fumar produce baja tasa de natalidad y bebs prematuros.  Las drogas ilegales o de la calle son muy perjudiciales para el beb. Estn absolutamente prohibidas. Un beb que nace de Progress Energy, ser adicto al nacer. Ese beb tendr los mismos sntomas de abstinencia que un adulto. SOLICITE ATENCIN MDICA SI:  Tiene preguntas o preocupaciones relacionadas con el embarazo. Es mejor que llame para formular las preguntas si no puede esperar hasta la prxima visita, que sentirse preocupada por ellas.  SOLICITE ATENCIN MDICA DE INMEDIATO SI:   La temperatura oral le sube a ms de 38,9 C (102 F) o lo que su mdico le indique.  Tiene una prdida de lquido por la vagina (canal de parto). Si sospecha una ruptura de las [place], tmese la temperatura y llame al profesional para informarlo sobre esto.  Observa unas pequeas manchas, una hemorragia vaginal o elimina cogulos. Notifique al profesional acerca de la cantidad y de cuntos apsitos est utilizando.  Presenta un olor desagradable en la secrecin vaginal y observa un cambio en el color, de transparente a blanco.  Ha vomitado durante ms de 24 horas.  Siente escalofros o le sube la fiebre.  Conley Rolls  falta el aire.  Siente ardor al Beatrix Shipper.  Baja o sube ms de 2 libras (900 g), o segn lo indicado por el profesional que la asiste.  Observa que sbitamente se le hinchan el rostro, las manos, los pies o las piernas.  Siente dolor en el vientre (abdominal). Las Federal[employer]Mogul en el ligamento redondo son Neomia Dear causa benigna frecuente de dolor abdominal durante el embarazo. El profesional que la asiste deber evaluarla.  Presenta dolor de cabeza intenso que no se [place].  Tiene problemas visuales, visin doble o borrosa.  Si no siente los movimientos del beb durante ms de 1 hora. Si piensa que el beb no se mueve tanto como lo haca habitualmente, coma algo que Psychologist, clinical y Target Corporation lado izquierdo durante [place]. El beb debe moverse al menos 4  5 veces por hora. Comunquese inmediatamente si el beb se mueve menos que lo indicado.  Se cae, se ve involucrada en un accidente automovilstico o sufre algn  tipo de traumatismo.  En su hogar hay violencia mental o fsica. Document Released: 09/29/2004 Document Revised: 09/14/2011 [facility] Patient Information 2014 [place], [place].  Lactancia materna  (Breastfeeding)  El cambio hormonal durante el Psychiatrist produce el desarrollo del tejido [place] y un aumento en el nmero y tamao de los conductos galactforos. La hormona prolactina permite que las protenas, los azcares y las grasas de la sangre produzcan la W[employer] Resources materna en las glndulas productoras de [place]. La hormona progesterona impide que la leche materna sea liberada antes del nacimiento del beb. Despus del nacimiento del beb, su nivel de progesterona disminuye permitiendo que la leche materna sea [place]. Pensar en el beb, as como la succin o Theatre manager, pueden estimular la liberacin de [place] de las glndulas productoras de [place].  La decisin de Company secretary) es una de las mejores opciones que usted puede hacer para usted y su beb. La informacin que sigue da una breve resea de los beneficios, as Lexicographer que debe saber sobre la [place].  LOS BENEFICIOS DE AMAMANTAR  Para el beb   La primera leche (calostro) ayuda al mejor funcionamiento del sistema digestivo del beb.   La leche tiene anticuerpos que provienen de la madre y que ayudan a prevenir las infecciones en el beb.   El beb tiene una menor incidencia de asma, alergias y del sndrome de muerte sbita del lactante (SMSL).   Los nutrientes de la [place] materna son mejores para el beb que la [place].  La leche materna mejora el desarrollo cerebral del beb.   Su beb tendr menos gases, clicos y estreimiento.  Es menos probable que el beb desarrolle otras enfermedades, como obesidad infantil, asma o diabetes mellitus. Para usted   La lactancia materna favorece el desarrollo de un vnculo muy especial entre la madre y el beb.   Es ms conveniente,  siempre disponible, a la [place] y [place].   La lactancia materna ayuda a quemar caloras y a perder el peso ganado durante el Enemy Swim.   Hace que el tero se contraiga ms rpidamente a su tamao normal y Consolidated Edison sangrado despus del [place].   Las [employer].C. Holdings que amamantan tienen menos riesgo de Environmental education officer osteoporosis o cncer de mama o de ovario en el futuro.  FRECUENCIA DEL AMAMANTAMIENTO   Un beb sano, nacido a trmino, puede amamantarse con tanta frecuencia como cada hora, o espaciar las comidas cada tres horas. La frecuencia en la lactancia varan de un beb a  otro.   Los recin nacidos deben ser alimentados por lo menos cada 2-3 horas Administrator y cada 4-5 horas durante la noche. Usted debe amamantarlo un mnimo de 8 tomas en un perodo de 24 horas.  Despierte al beb para amamantarlo si han pasado 3-4 horas desde la ltima comida.  Amamante cuando sienta la necesidad de reducir la plenitud de sus senos o cuando el beb muestre signos de [place]. Las seales de que el beb puede Gentry Fitz son:  Lenora Boys su estado de alerta o vigilancia.  Se estira.  Mueve la cabeza de un lado a otro.  Mueve la cabeza y abre la boca cuando se le toca la mejilla o la boca (reflejo de succin).  Aumenta las vocalizaciones, tales como sonidos de succin, relamerse los labios, arrullos, suspiros, o chirridos.  Mueve la [employer] boca.  Se chupa con ganas los dedos o las manos.  Agitacin.  Llanto intermitente.  Los signos de hambre extrema requerirn que lo calme y lo consuele antes de tratar de alimentarlo. Los signos de hambre extrema son:  Agitacin.  Llanto fuerte e intenso.  Gritos.  El amamantamiento frecuente la ayudar a producir ms [place] y a Education officer, community de Engineer, mining en los pezones e hinchazn de las [place].  LACTANCIA MATERNA   Ya sea que se encuentre acostada o sentada, asegrese que el abdomen del beb est enfrente el suyo.   Sostenga  la mama con el pulgar por arriba y los otros 4 dedos por debajo del pezn. Asegrese que sus dedos se encuentren lejos del pezn y de la boca del beb.   Empuje suavemente los labios del beb con el pezn o con el dedo.   Cuando la boca del beb se abra lo suficiente, introduzca el pezn y la zona oscura que lo rodea (areola) tanto como le sea posible dentro de la boca.  Debe haber ms areola visible por arriba del labio superior que por debajo del labio inferior.  La lengua del beb debe estar entre la enca inferior y el seno.  Asegrese de que la boca del beb est en la posicin correcta alrededor del pezn (prendida). Los labios del beb deben crear un sello sobre su pecho.  Las seales de que el beb se ha prendido eficazmente al pezn son:  Payton Doughty o succiona sin dolor.  Se escucha que traga Lyondell Chemical.  No hace ruidos ni chasquidos.  Hay movimientos musculares por arriba y por delante de sus odos al Printmaker.  El beb debe succionar unos 2-3 minutos para que salga la [place]. Permita que el nio se alimente en cada mama todo lo que desee. Alimente al beb hasta que se desprenda o se quede dormido en Freight forwarder y luego ofrzcale el segundo pecho.  Las seales de que el beb est lleno y satisfecho son:  Disminuye gradualmente el nmero de succiones o no succiona.  Se queda dormido.  Extiende o relaja su cuerpo.  Retiene una pequea cantidad de [facility] boca.  Se desprende del pecho por s mismo.  Los signos de una lactancia materna eficaz son:  Los senos han aumentado la firmeza, el peso y el tamao antes de la alimentacin.  Son ms blandos despus de amamantar.  Un aumento del volumen de [place], y tambin el cambio de su consistencia y color se producen hacia el quinto da de Tour manager.  La congestin mamaria se [place] al dar de [place].  Los pezones no duelen,  ni estn agrietados ni sangran.  De ser necesario, interrumpa la succin  poniendo su dedo en la esquina de la boca del beb y deslizando el dedo entre sus encas. A continuacin, retire la mama de su boca.  Es comn que los bebs regurgiten un poco despus de comer.  A menudo los bebs tragan aire al alimentarse. Esto puede hacer que se sienta molesto. Hacer eructar al beb al [place] de pecho puede ser de [place].  Se recomiendan suplementos de vitamina D para los bebs que reciben slo [address]  Evite el uso del chupete durante las primeras 4 a 6 semanas de vida.  Evite la alimentacin suplementaria con agua, frmula o jugo en lugar de la Colgate Palmolive. La leche materna es todo el alimento que el beb necesita. No es necesario que el nio ingiera agua o preparados de bibern. Sus pechos producirn ms leche si se evita la alimentacin suplementaria durante las primeras semanas. COMO SABER SI EL BEB OBTIENE LA SUFICIENTE LECHE MATERNA  Preguntarse si el beb obtiene la cantidad suficiente de [place] es una preocupacin frecuente Lucent Technologies. Puede asegurarse que el beb tiene la leche suficiente si:   El beb succiona activamente y usted escucha que traga.   El beb parece estar relajado y satisfecho despus de Psychologist, clinical.   El nio se alimenta al menos 8 a 12 veces en 24 horas.  Durante los primeros 3 a 5 das de vida:  Moja 3-5 paales en 24 horas. La materia fecal debe ser blanda y [place].  Tiene al menos 3 a 4 deposiciones en 24 horas. La materia fecal debe ser blanda y [place].  A los 5-7 das de vida, el beb debe tener al menos 3-6 deposiciones en 24 horas. La materia fecal debe ser grumosa y [place] a los 5 [address]  [address] de [place].  Su beb tiene una prdida de Psychologist, counselling a 7al 10% durante los primeros 3 [address]  [address] de [address]  El beb no pierde peso despus de 3-7 [address] de [address]  El beb debe aumentar 4 a 6 libras (120 a 170 gr.) por semana despus de los 4 [address] de vida.  Aumenta de [place] a los [address] de vida y vuelve al peso del nacimiento dentro de  las 2 semanas. CONGESTIN MAMARIA  Durante la primera semana despus del [place], usted puede experimentar hinchazn en las mamas (congestin [place]). Al estar congestionadas, las mamas se sienten pesadas, calientes o sensibles al tacto. El pico de la congestin ocurre a las 24 -48 horas despus del parto.   La congestin puede disminuirse:  Continuando con la Tour manager.  Aumentando la frecuencia.  Tomando duchas calientes o aplicando calor hmedo en los senos antes de cada comida. Esto aumenta la circulacin y [place] a que la [place].   Masajeando suavemente el pecho antes y  [employer]. Con las yemas de los dedos, masajee la pared del pecho hacia el pezn en un movimiento circular.   Asegurarse de que el beb vaca al menos uno de sus pechos en cada alimentacin. Tambin ayuda si comienza la siguiente toma en el otro seno.   Extraiga manualmente o con un sacaleches las mamas para vaciar los pechos si el beb tiene sueo o no se aliment bien. Tambin puede extraer la WPS Resources cuando vuelva a trabajar o si siente que se estn congestionando las [place].  Asegrese de que el beb se prende y est bien colocado durante la Market researcher. Si sigue estas indicaciones, la congestin Geologist, engineering  en 24 a 48 horas. Si an tiene dificultades, consulte a Barista.  CUDESE USTED MISMA  Cuide sus mamas.   Bese o dchese diariamente.   Evite usar Eaton Corporation.   Use un sostn de soporte Evite el uso de sostenes con aro.  Seque al aire sus pezones durante 3-4 minutos despus de cada comida.   Utilice slo apsitos de algodn en el sostn para absorber las prdidas de [place]. La prdida de un poco de Deere & Company las comidas es normal.   Use solamente lanolina pura en sus pezones despus de Museum/gallery exhibitions officer. Usted no tiene que lavarla antes de alimentar al beb. Otra opcin es sacarse unas gotas de [place] y Pepco Holdings pezones.  Continuar con  los autocontroles de la mama. Cudese.   Consuma alimentos saludables. Alterne 3 comidas con 3 colaciones.  Evite los alimentos que usted nota que perjudican al beb.  Dixie Dials, jugos de fruta y agua para Patent examiner su sed (aproximadamente 8 vasos al Futures trader).   Descanse con frecuencia, reljese y tome sus vitaminas prenatales para evitar la fatiga, el estrs y la anemia.  Evite masticar y fumar tabaco.  Evite el consumo de alcohol y drogas.  Tome medicamentos de venta libre y recetados tal como le indic su mdico o Social research officer, government. Siempre debe consultar con su mdico o farmacutico antes de tomar cualquier medicamento, vitamina o suplemento de hierbas.  Sepa que durante la lactancia puede quedar embarazada. Si lo desea, hable con su mdico acerca de la planificacin familiar y los mtodos anticonceptivos seguros que puede utilizar durante la Market researcher. SOLICITE ATENCIN MDICA SI:   Usted siente que quiere dejar de Museum/gallery exhibitions officer o se siente frustrada con la lactancia.  Siente dolor en los senos o en los pezones.  Sus pezones estn agrietados o Water quality scientist.  Sus pechos estn irritados, sensibles o calientes.  Tiene un rea hinchada en cualquiera de los senos.  Siente escalofros o fiebre.  Tiene nuseas o vmitos.  Observa un drenaje en los pezones.  Sus mamas no se llenan antes de Marine scientist al 5to da despus del [place].  Se siente triste y deprimida.  El nio est demasiado somnoliento como para comer.  El nio tiene problemas para Industrial/product designer.   Moja menos de 3 paales en 24 horas.  Mueve el intestino menos de 3 veces en 24 horas.  La piel del beb o la parte blanca de sus ojos est ms amarilla.   El beb no ha aumentado de [place] a los [address] de [place]. ASEGRESE DE QUE:   Comprende estas instrucciones.  Controlar su enfermedad.  Solicitar ayuda de inmediato si no mejora o si empeora. Document Released: 12/20/2004 Document Revised: 09/14/2011 [facility] Patient  Information 2014 [place], [place]. Rinitis Alrgica (Allergic Rhinitis) La rinitis alrgica aparece cuando las membranas mucosas de la nariz reaccionan a los alrgenos. Los alrgenos son las partculas que estn en el aire y a las que el organismo responde cuando existe una Automotive engineer. Esto hace que usted libere anticuerpos de Programmer, multimedia. A travs de una sucesin de procesos, finalmente se libera histamina (de ah el uso de antihistamnicos) en el torrente sanguneo. Aunque esto implica una proteccin para su organismo, es lo que le produce las [place]., como estornudos frecuentes, congestin, picazn y goteos de Architectural technologist.  CAUSAS Los alergenos del polen pueden provenir del csped, rboles y hierbas. Esto produce la rinitis alrgica estacional, o "fiebre de heno". Otras alrgenos pueden ocasionar rinitis alrgica persistente (rinitis alrgica  perenne) como aquellos que contienen los caros del polvo del hogar, el pelaje de las mascotas y las esporas del moho.  SNTOMAS  Congestin nasal.  Picazn y goteo de la nariz con estornudos y lagrimeo de los ojos.  Generalmente, tambin puede haber picazn de la boca, ojos y odos. Las alergias no pueden curarse pero pueden controlarse con medicamentos. DIAGNSTICO Si no reconoce exactamente cul es el alrgeno que le ocasiona el problema, podrn realizarle pruebas de [place], o de piel para determinarlo. TRATAMIENTO  Evite el alrgeno.  Podrn ser tiles medicamentos y vacunas para la alergia (inmunoterapia).  Con frecuencia la fiebre de heno se trata simplemente con antihistamnicos en forma de pldoras o sprays nasales. Los antihistamnicos bloquean los efectos de la histamina. Existen medicamentos de venta libre que lo ayudarn a Associate Professor, la congestin nasal y la hinchazn alrededor de los ojos. Consulte con el profesional antes de tomar o Civil Service fast streamer. Si estos medicamentos no le Merchant navy officer, existen muchos  otros nuevos que el profesional que lo asiste puede prescribirle. Si las medidas iniciales no son efectivas, podrn utilizarse medicamentos ms fuertes. Las inyecciones desensibilizantes pueden utilizarse si los otros medicamentos fracasan. La desensibilizacin aparece cuando un paciente recibe inyecciones continuas hasta que el cuerpo se vuelve menos sensible al alrgeno. Asegrese de Education officer, environmental un seguimiento con el profesional que lo asiste si los problemas continan. SOLICITE ANTENCIN MDICA SI:   Le sube la temperatura a ms de 100.5 F (38.1 C).  Presenta tos que no se alivia (persistente).  Le falta el aire.  Comienza a respirar con dificultad.  Los sntomas interfieren con las actividades diarias. Document Released: 09/29/2004 Document Revised: 03/14/2011 [facility] Patient Information 2014 [place], [place].

## 2012-10-18 NOTE — Progress Notes (Signed)
Pulse: 84

## 2012-10-18 NOTE — Progress Notes (Signed)
U/S scheduled on 10/25/12 at 930 am.

## 2012-10-18 NOTE — Progress Notes (Signed)
U/S for growth given AMA Begin 2x/wk testing at 36 wks. Lots of phlegm and mucous.  On Sudafed--Add Mucinex and Claritin Check urine culture for large hgb.

## 2012-10-20 LAB — CULTURE, OB URINE

## 2012-10-22 ENCOUNTER — Telehealth: Payer: Self-pay | Admitting: *Deleted

## 2012-10-22 DIAGNOSIS — N390 Urinary tract infection, site not specified: Secondary | ICD-10-CM

## 2012-10-22 MED ORDER — AMOXICILLIN 500 MG PO CAPS: 500.0000 mg | ORAL_CAPSULE | Freq: Three times a day (TID) | ORAL | Status: DC

## 2012-10-22 NOTE — Telephone Encounter (Signed)
Patient informed Rx sent to pharmacy.

## 2012-10-22 NOTE — Telephone Encounter (Signed)
Message copied by Mannie Stabile on Mon Oct 22, 2012  4:55 PM ------      Message from: Reva Bores      Created: Sat Oct 20, 2012  2:52 PM       Enterococcus UTI--needs treatment Amoxicillin 500 mg tid x 7 d--please call in for pt. ------

## 2012-10-25 ENCOUNTER — Ambulatory Visit (INDEPENDENT_AMBULATORY_CARE_PROVIDER_SITE_OTHER): Payer: Medicaid Other | Admitting: Obstetrics & Gynecology

## 2012-10-25 ENCOUNTER — Ambulatory Visit (HOSPITAL_COMMUNITY)
Admission: RE | Admit: 2012-10-25 | Discharge: 2012-10-25 | Disposition: A | Discharge status: Home / Self Care | Payer: Medicaid Other | Source: Ambulatory Visit | Attending: Family Medicine | Admitting: Family Medicine

## 2012-10-25 ENCOUNTER — Other Ambulatory Visit: Payer: Self-pay | Admitting: Family Medicine

## 2012-10-25 VITALS — BP 101/68 | Temp 97.0°F | Wt 167.3 lb

## 2012-10-25 DIAGNOSIS — O09523 Supervision of elderly multigravida, third trimester: Secondary | ICD-10-CM

## 2012-10-25 DIAGNOSIS — O09299 Supervision of pregnancy with other poor reproductive or obstetric history, unspecified trimester: Secondary | ICD-10-CM | POA: Insufficient documentation

## 2012-10-25 DIAGNOSIS — Z8751 Personal history of pre-term labor: Secondary | ICD-10-CM | POA: Insufficient documentation

## 2012-10-25 DIAGNOSIS — O09529 Supervision of elderly multigravida, unspecified trimester: Secondary | ICD-10-CM

## 2012-10-25 LAB — POCT URINALYSIS DIP (DEVICE)
Bilirubin Urine: NEGATIVE
Glucose, UA: NEGATIVE mg/dL
Nitrite: NEGATIVE
Urobilinogen, UA: 0.2 mg/dL (ref 0.0–1.0)

## 2012-10-25 IMAGING — US US OB FOLLOW-UP
1 series · 12 of 28 positions shown · non-contrast
Comparison: none

[Series 1: us ob follow up · 36 acquisitions, 12 frames shown]
[im 2/36]
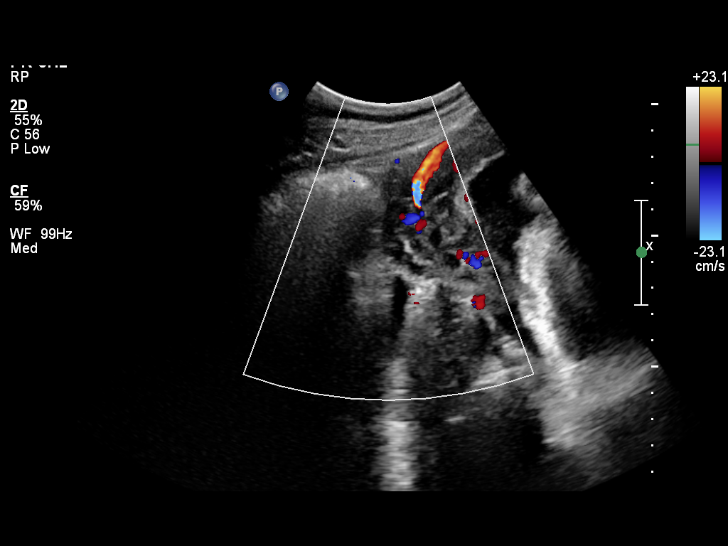
[im 4/36]
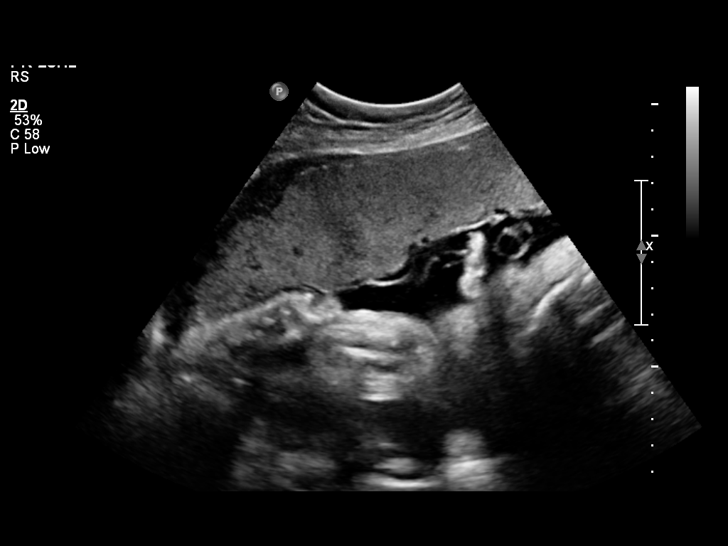
[im 7/36]
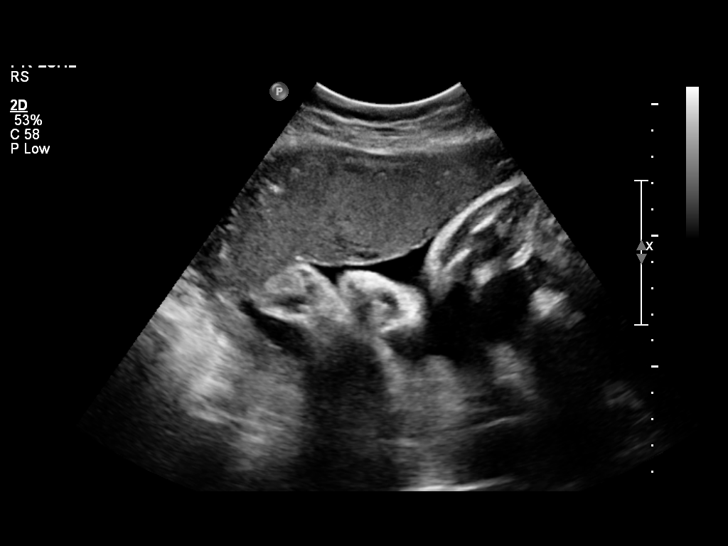
[im 11/36]
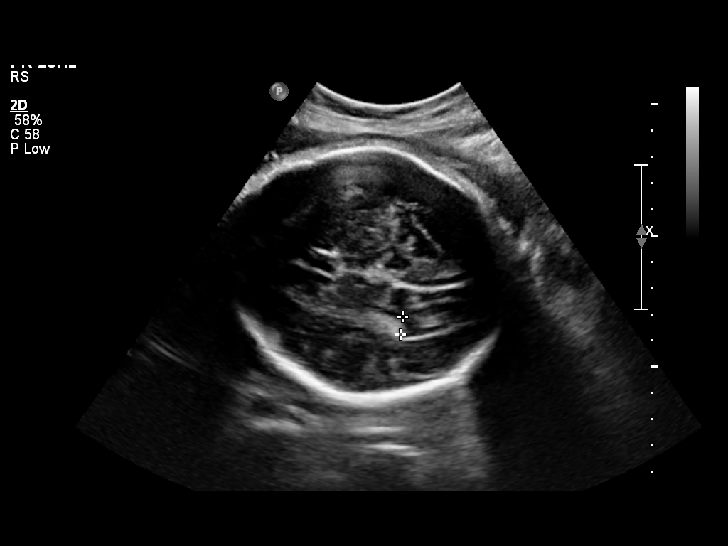
[im 13/36]
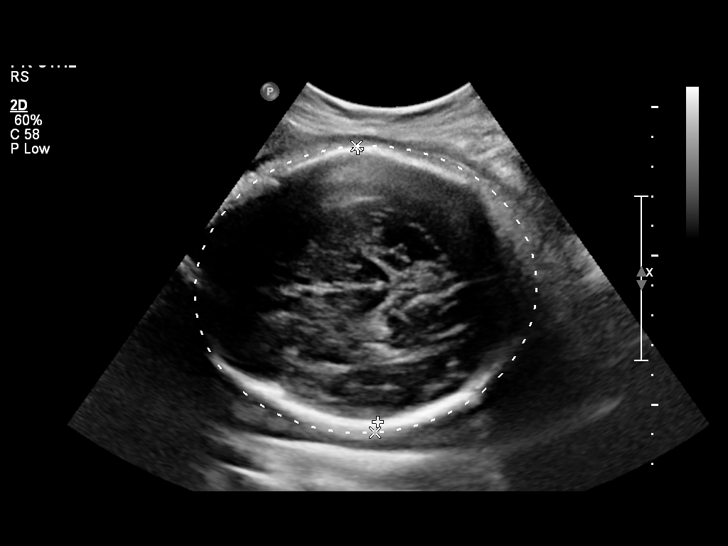
[im 16/36]
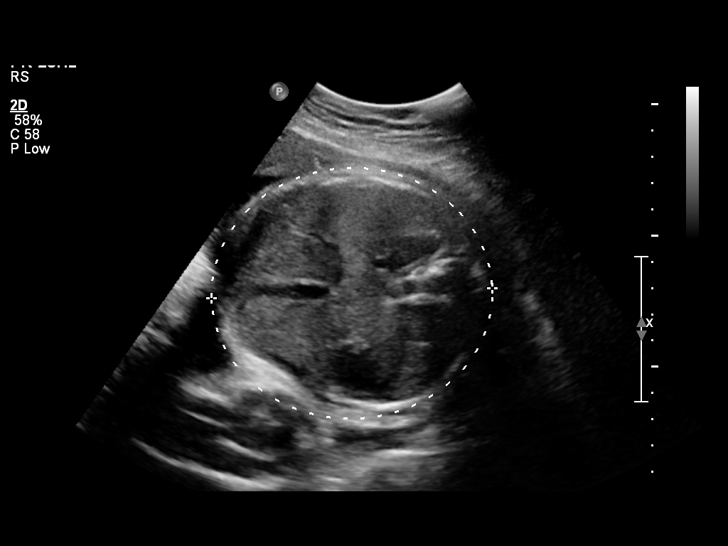
[im 20/36]
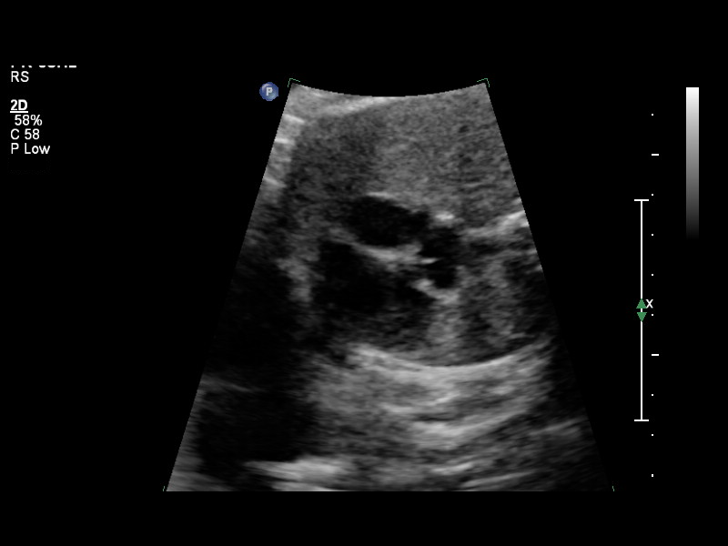
[im 23/36]
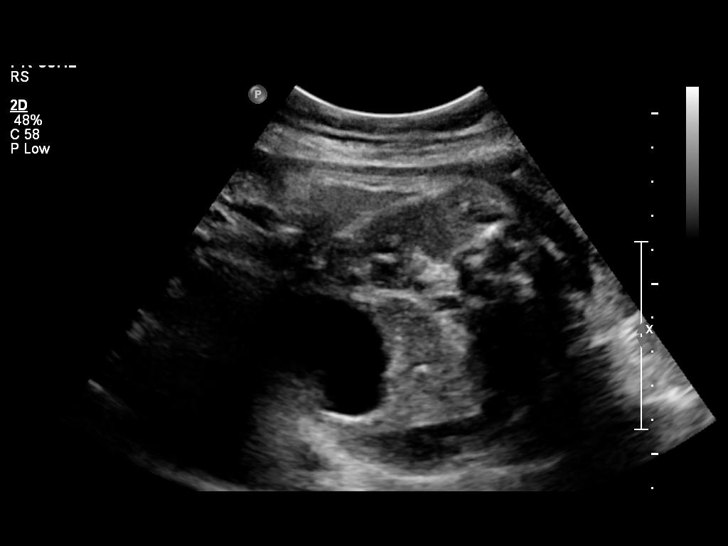
[im 25/36]
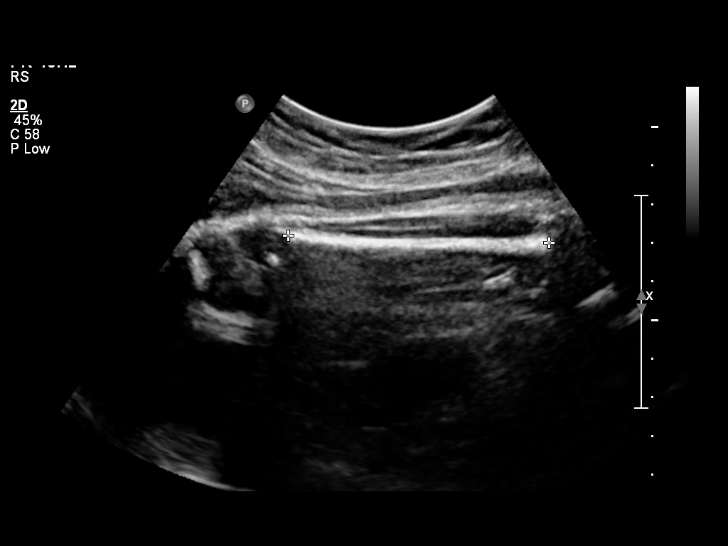
[im 29/36]
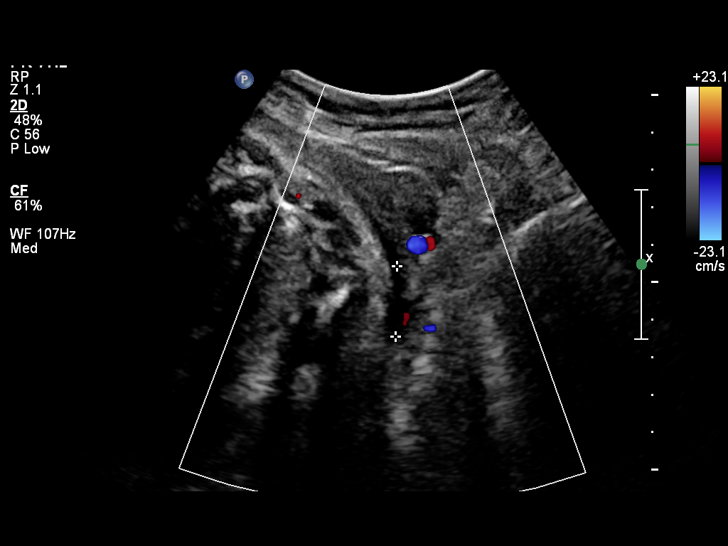
[im 32/36]
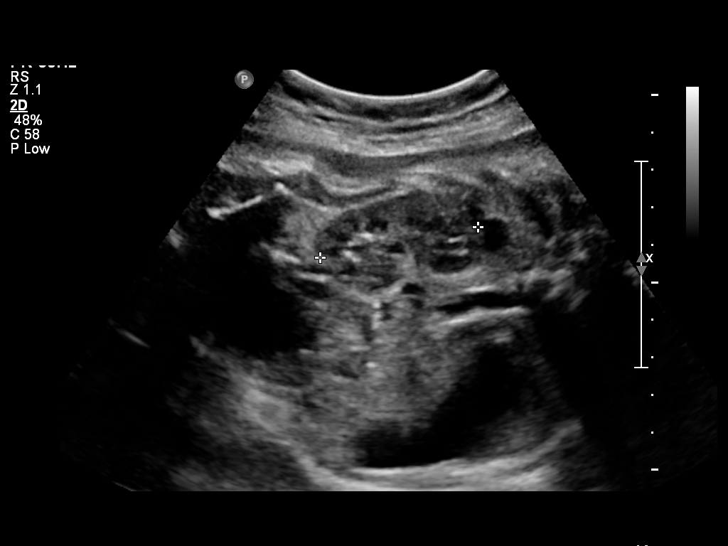
[im 34/36]
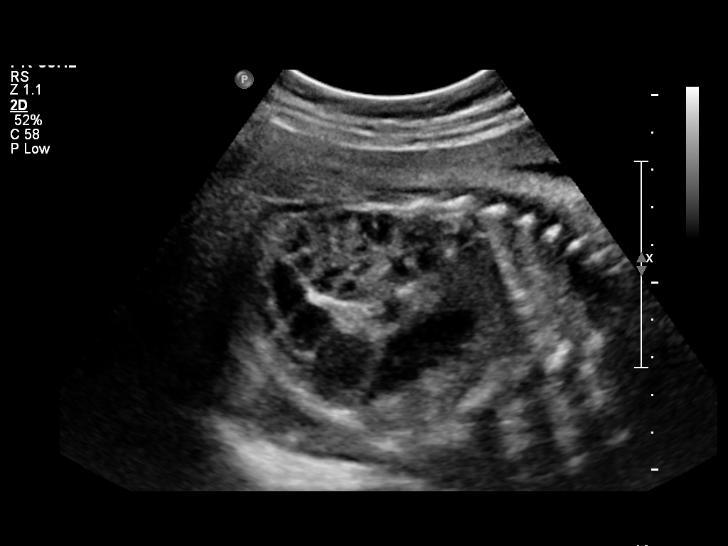

[12 of 28 positions shown; findings below may reference images not displayed]

OBSTETRICS REPORT
                      (Signed Final [DATE] [DATE])

Service(s) Provided

 US OB FOLLOW UP                                       76816.1
Indications

 Advanced maternal age (AMA), Multigravida
 Poor obstetric history: Previous gestational          [WC]
 diabetes
 Poor obstetric history: Previous preterm delivery     [WC]
Fetal Evaluation

 Num Of Fetuses:    1
 Fetal Heart Rate:  138                          bpm
 Cardiac Activity:  Observed
 Presentation:      Cephalic
 Placenta:          Anterior, above cervical os
 P. Cord            Visualized, central
 Insertion:

 Amniotic Fluid
 AFI FV:      Subjectively within normal limits
 AFI Sum:     10.45   cm       24  %Tile     Larg Pckt:    3.91  cm
 RUQ:   2.42    cm   RLQ:    2.25   cm    LUQ:   3.91    cm   LLQ:    1.87   cm
Biometry

 BPD:       91  mm     G. Age:  36w 6d                CI:        78.75   70 - 86
                                                      FL/HC:      20.9   20.1 -

 HC:     324.3  mm     G. Age:  36w 5d       62  %    HC/AC:      0.99   0.93 -

 AC:     326.4  mm     G. Age:  36w 4d       92  %    FL/BPD:     74.6   71 - 87
 FL:      67.9  mm     G. Age:  34w 6d       43  %    FL/AC:      20.8   20 - 24
 HUM:     58.6  mm     G. Age:  33w 6d       47  %

 Est. FW:    [WC]  gm      6 lb 5 oz     82  %
Gestational Age

 LMP:           34w 6d        Date:  [DATE]                 EDD:   [DATE]
 U/S Today:     36w 2d                                        EDD:   [DATE]
 Best:          34w 6d     Det. By:  LMP  ([DATE])          EDD:   [DATE]
Anatomy

 Cranium:          Appears normal         Aortic Arch:      Previously seen
 Fetal Cavum:      Previously seen        Ductal Arch:      Previously seen
 Ventricles:       Appears normal         Diaphragm:        Previously seen
 Choroid Plexus:   Previously seen        Stomach:          Appears normal, left
                                                            sided
 Cerebellum:       Previously seen        Abdomen:          Appears normal
 Posterior Fossa:  Previously seen        Abdominal Wall:   Previously seen
 Nuchal Fold:      Previously seen        Cord Vessels:     Previously seen
 Face:             Orbits and profile     Kidneys:          Appear normal
                   previously seen
 Lips:             Previously seen        Bladder:          Appears normal
 Heart:            Previously seen        Spine:            Previously seen
 RVOT:             Previously seen        Lower             Previously seen
                                          Extremities:
 LVOT:             Previously seen        Upper             Previously seen
                                          Extremities:

 Other:  Fetus appears to be a female. Heels and 5th digit previously
         visualized.
Cervix Uterus Adnexa

 Cervix:       Not visualized (advanced GA >34 wks)
 Uterus:       No abnormality visualized.
 Left Ovary:    No adnexal mass visualized.
 Right Ovary:   No adnexal mass visualized.

 Adnexa:     No abnormality visualized.
Impression

 Single IUP at 34 [DATE] weeks
 Fetal growth is appropriate (82nd %tile)
 Somewhat limited views of the anatomy obtained due to late
 gestational age
 No gross anomalies noted
 Anterior placenta without previa
 Normal amniotic fluid volume
Recommendations

 Recommend antepartum fetal testing beginning at 36 weeks
 due to advanced maternal age > 40
 Would offer induction of labor by EDD (but not earier than 39
 weeks) if undelivered at that time.

 questions or concerns.

## 2012-10-25 NOTE — Patient Instructions (Signed)
Parto prematuro  (Preterm Labor) El parto prematuro comienza antes de la semana 37 de embarazo. La duracin de un embarazo normal es de 39 a 41 semanas.  CAUSAS  Generalmente no hay una causa que pueda identificarse. Sin embargo, una de las causas conocidas ms frecuentes son las infecciones. Las infecciones del tero, el cuello, la vagina, el lquido amnitico, la vejiga, los riones y hasta de los pulmones (neumona) pueden hacer que el trabajo de parto se inicie. Otras causas son:   Infecciones urogenitales, como infecciones por hongos y vaginosis bacteriana.  Anormalidades uterinas (forma del tero, sptum uterino, fibromas, hemorragias en la placenta).  Un cuello que ha sido operado y se abre prematuramente.  Malformaciones del beb.  Gestaciones mltiples (mellizos, trillizos y ms).  Ruptura del saco amnitico. :Otros factores de riesgo del parto prematuro son   Historia previa de parto prematuro.  Ruptura prematura de las membranas.  La placenta cubre la apertura del cuello (placenta previa).  La placenta se separa del tero (abrupcin placentaria).  El cuello es demasiado dbil para contener al beb en el tero (cuello incompetente).  Hay mucho lquido en el saco amnitico (polihidramnios).  Consumo de drogas o hbito de fumar durante el embarazo.  No aumentar de peso lo suficiente durante el embarazo.  Mujeres menores de 18 aos o mayores de 35 aos aos.  Nivel socioeconmico bajo.  Raza afroamericana. SNTOMAS  Los signos y sntomas son:   Clicos del tipo menstrual  Contracciones con un intervalo entre 30 y 70 segundos, comienzan a ser regulares, se hacen ms frecuentes y se hacen ms intensas y dolorosas.  Contracciones que comienzan en la parte superior del tero y se expanden hacia abajo, hacia la zona inferior del abdomen y la espalda.  Sensacin de presin en la pelvis o dolor en la espalda.  Aparece una secrecin acuosa o sanguinolenta por la  vagina. DIAGNSTICO  El diagnstico puede confirmarse:   Con un examen vaginal.  Ecografa del cuello.  Muestra (hisopado) de las secreciones crvico-vaginales. Estas muestras se analizan para buscar la presencia de fibronectina fetal. Esta protena que se encuentra en las secreciones del tero y se asocia con el parto prematuro.  Monitoreo fetal TRATAMIENTO  Segn el tiempo del embarazo y otras circunstancias, el mdico puede indicar reposo en cama. Si es necesario, le indicarn medicamentos para detener las contracciones y apurar la maduracin de los pulmones del feto. Si el trabajo de parto se inicia antes de las 34 semanas de embarazo, se recomienda la hospitalizacin. El tratamiento depende de las condiciones en que se encuentre la madre y el beb.  PREVENCIN  Hay algunas cosas que una madre puede hacer para disminuir el riesgo de trabajo de parto prematuro en futuros embarazos. Una mam puede:   Dejar de fumar.  Mantener un peso saludable y evitar sustancias qumicas y drogas innecesarias.  Controlar todo tipo de infeccin.  Informar al mdico si tiene una historia conocida de parto prematuro. Document Released: 03/29/2007 Document Revised: 03/14/2011 [employer] Patient Information 2014 [employer].  

## 2012-10-25 NOTE — Progress Notes (Signed)
Mild varicose vs in left leg. Start NST next week

## 2012-10-25 NOTE — Progress Notes (Signed)
Pulse- 69  Pressure-lower abd

## 2012-11-01 ENCOUNTER — Ambulatory Visit (INDEPENDENT_AMBULATORY_CARE_PROVIDER_SITE_OTHER): Payer: Medicaid Other | Admitting: Advanced Practice Midwife

## 2012-11-01 VITALS — BP 93/68 | Wt 168.3 lb

## 2012-11-01 DIAGNOSIS — O09523 Supervision of elderly multigravida, third trimester: Secondary | ICD-10-CM

## 2012-11-01 DIAGNOSIS — O09529 Supervision of elderly multigravida, unspecified trimester: Secondary | ICD-10-CM

## 2012-11-01 LAB — POCT URINALYSIS DIP (DEVICE)
Bilirubin Urine: NEGATIVE
Glucose, UA: NEGATIVE mg/dL
Leukocytes, UA: NEGATIVE
Nitrite: NEGATIVE
Urobilinogen, UA: 1 mg/dL (ref 0.0–1.0)
pH: 7 (ref 5.0–8.0)

## 2012-11-01 LAB — OB RESULTS CONSOLE GC/CHLAMYDIA: Chlamydia: NEGATIVE

## 2012-11-01 NOTE — Progress Notes (Signed)
Pulse: 73

## 2012-11-01 NOTE — Progress Notes (Signed)
Doing well.  Good fetal movement, denies vaginal bleeding, LOF, regular contractions. GBS collected.

## 2012-11-02 LAB — GC/CHLAMYDIA PROBE AMP
CT Probe RNA: NEGATIVE
GC Probe RNA: NEGATIVE

## 2012-11-08 ENCOUNTER — Ambulatory Visit (INDEPENDENT_AMBULATORY_CARE_PROVIDER_SITE_OTHER): Payer: Medicaid Other | Admitting: Obstetrics & Gynecology

## 2012-11-08 VITALS — BP 107/71 | Temp 96.4°F | Wt 170.2 lb

## 2012-11-08 DIAGNOSIS — O09219 Supervision of pregnancy with history of pre-term labor, unspecified trimester: Secondary | ICD-10-CM

## 2012-11-08 DIAGNOSIS — O0993 Supervision of high risk pregnancy, unspecified, third trimester: Secondary | ICD-10-CM

## 2012-11-08 DIAGNOSIS — O09529 Supervision of elderly multigravida, unspecified trimester: Secondary | ICD-10-CM

## 2012-11-08 LAB — POCT URINALYSIS DIP (DEVICE)
Bilirubin Urine: NEGATIVE
Protein, ur: NEGATIVE mg/dL
Specific Gravity, Urine: 1.02 (ref 1.005–1.030)
Urobilinogen, UA: 1 mg/dL (ref 0.0–1.0)
pH: 7 (ref 5.0–8.0)

## 2012-11-08 NOTE — Progress Notes (Signed)
NST reactive.  No complaints.  Extreme AMA--in testing with induction at 40 weeks.  GBS negative.  GC/Chlam neg x2

## 2012-11-08 NOTE — Progress Notes (Signed)
P= 69 Pt. C/o of lower abdominal/pelvic pressure intermittently.

## 2012-11-09 ENCOUNTER — Other Ambulatory Visit: Payer: Self-pay

## 2012-11-12 ENCOUNTER — Ambulatory Visit (INDEPENDENT_AMBULATORY_CARE_PROVIDER_SITE_OTHER): Payer: Medicaid Other | Admitting: *Deleted

## 2012-11-12 VITALS — BP 107/65

## 2012-11-12 DIAGNOSIS — O09529 Supervision of elderly multigravida, unspecified trimester: Secondary | ICD-10-CM

## 2012-11-12 DIAGNOSIS — O09523 Supervision of elderly multigravida, third trimester: Secondary | ICD-10-CM

## 2012-11-12 NOTE — Progress Notes (Signed)
P = 76 

## 2012-11-15 ENCOUNTER — Ambulatory Visit (INDEPENDENT_AMBULATORY_CARE_PROVIDER_SITE_OTHER): Payer: Medicaid Other | Admitting: Obstetrics and Gynecology

## 2012-11-15 VITALS — BP 103/65 | Wt 173.7 lb

## 2012-11-15 DIAGNOSIS — O09523 Supervision of elderly multigravida, third trimester: Secondary | ICD-10-CM

## 2012-11-15 DIAGNOSIS — O09529 Supervision of elderly multigravida, unspecified trimester: Secondary | ICD-10-CM

## 2012-11-15 DIAGNOSIS — O09213 Supervision of pregnancy with history of pre-term labor, third trimester: Secondary | ICD-10-CM

## 2012-11-15 DIAGNOSIS — O0993 Supervision of high risk pregnancy, unspecified, third trimester: Secondary | ICD-10-CM

## 2012-11-15 DIAGNOSIS — O0990 Supervision of high risk pregnancy, unspecified, unspecified trimester: Secondary | ICD-10-CM

## 2012-11-15 DIAGNOSIS — O09219 Supervision of pregnancy with history of pre-term labor, unspecified trimester: Secondary | ICD-10-CM

## 2012-11-15 LAB — POCT URINALYSIS DIP (DEVICE)
Bilirubin Urine: NEGATIVE
Glucose, UA: NEGATIVE mg/dL
Nitrite: NEGATIVE
Protein, ur: NEGATIVE mg/dL
Urobilinogen, UA: 2 mg/dL — ABNORMAL HIGH (ref 0.0–1.0)
pH: 7 (ref 5.0–8.0)

## 2012-11-15 MED ORDER — FLUCONAZOLE 150 MG PO TABS: 150.0000 mg | ORAL_TABLET | Freq: Once | ORAL | Status: DC

## 2012-11-15 NOTE — Progress Notes (Signed)
NST reviewed and reactive. FM/labor precautions reviewed. Patient completed antibiotic course and now has vaginal pruritis. Rx Diflucan provided, advised to use monistat externally. Fetus noted to be in breech presentation (was vertex last week). Discussed expectant management with the hope that fetus is vertex at time of delivery vs ECV vs scheduled cesarean section. Patient opted to return on Monday for repeat scan at time of NST and will decide on plan if still breech

## 2012-11-15 NOTE — Progress Notes (Signed)
P=72 Pt for NST, AFI, Ob appt/ Pt c/o of vaginal itching for 2 days.

## 2012-11-19 ENCOUNTER — Ambulatory Visit (INDEPENDENT_AMBULATORY_CARE_PROVIDER_SITE_OTHER): Payer: Medicaid Other | Admitting: *Deleted

## 2012-11-19 VITALS — BP 111/70

## 2012-11-19 DIAGNOSIS — O09523 Supervision of elderly multigravida, third trimester: Secondary | ICD-10-CM

## 2012-11-19 DIAGNOSIS — O09529 Supervision of elderly multigravida, unspecified trimester: Secondary | ICD-10-CM

## 2012-11-19 NOTE — Progress Notes (Signed)
NST reviewed and reactive.  

## 2012-11-19 NOTE — Progress Notes (Signed)
P = 81  Frank breech presentation today- Dr. Shawnie Pons notified.  As per discussion with Dr. Jolayne Panther from 11/13, pt now desiring External Version procedure. Risks of spontaneous labor, SROM, fetal distress and possible uterine rupture explained to pt.  Pt understands that C/S may be necessary if complications arise. Pt was given instructions to be NPO after midnight and to return tomorrow @ 0700. Interpreter- Marlynn Perking present during visit.

## 2012-11-20 ENCOUNTER — Observation Stay (HOSPITAL_COMMUNITY)
Admission: RE | Admit: 2012-11-20 | Discharge: 2012-11-20 | Disposition: A | Discharge status: Home / Self Care | Payer: Medicaid Other | Source: Ambulatory Visit | Attending: Family Medicine | Admitting: Family Medicine

## 2012-11-20 VITALS — BP 110/68 | HR 91 | Temp 98.2°F | Resp 18 | Ht 63.0 in | Wt 162.0 lb

## 2012-11-20 DIAGNOSIS — IMO0002 Reserved for concepts with insufficient information to code with codable children: Secondary | ICD-10-CM

## 2012-11-20 DIAGNOSIS — O09529 Supervision of elderly multigravida, unspecified trimester: Secondary | ICD-10-CM | POA: Insufficient documentation

## 2012-11-20 DIAGNOSIS — O0993 Supervision of high risk pregnancy, unspecified, third trimester: Secondary | ICD-10-CM

## 2012-11-20 DIAGNOSIS — O321XX0 Maternal care for breech presentation, not applicable or unspecified: Principal | ICD-10-CM | POA: Insufficient documentation

## 2012-11-20 MED ORDER — OXYTOCIN 40 UNITS IN LACTATED RINGERS INFUSION - SIMPLE MED
INTRAVENOUS | Status: AC
  Filled 2012-11-20: qty 1000

## 2012-11-20 MED ORDER — TERBUTALINE SULFATE 1 MG/ML IJ SOLN
0.2500 mg | Freq: Once | INTRAMUSCULAR | Status: AC
  Administered 2012-11-20: 0.25 mg via SUBCUTANEOUS

## 2012-11-20 MED ORDER — TERBUTALINE SULFATE 1 MG/ML IJ SOLN
INTRAMUSCULAR | Status: AC
  Filled 2012-11-20: qty 1

## 2012-11-20 NOTE — Progress Notes (Signed)
Patient ID: Monica Blake, female   DOB: 04/23/1970, 42 y.o.   MRN: [id number] After informed verbal consent, Terbutaline 0.25 mg SQ given, ECV was attempted under Ultrasound guidance.  Frank breech--Attempt at forward roll x 2, backward roll x 2 then forward roll again.  No movement of fetus noted.   FHR was reactive before and after the procedure.   Pt. Tolerated the procedure well.

## 2012-11-20 NOTE — H&P (Signed)
  Monica Blake is an 42 y.o. [phone] [redacted]w[redacted]d female.   Chief Complaint: breech presentation  HPI: 42 y.o. [id number] @ [redacted]w[redacted]d for ECV today.   Past Medical History  Diagnosis Date  . Preterm labor     1998    Past Surgical History  Procedure Laterality Date  . Rectal surgery      Family History  Problem Relation Age of Onset  . Diabetes Father    Social History:  reports that she has never smoked. She has never used smokeless tobacco. She reports that she does not drink alcohol or use illicit drugs.  Allergies: No Known Allergies  Medications Prior to Admission  Medication Sig Dispense Refill  . ferrous sulfate (FERROUSUL) 325 (65 FE) MG tablet Take 1 tablet (325 mg total) by mouth daily with breakfast.  60 tablet  5  . flintstones complete (FLINTSTONES) 60 MG chewable tablet Chew 1 tablet by mouth daily.       Marland Kitchen loratadine (CLARITIN) 10 MG tablet Take 1 tablet (10 mg total) by mouth daily.  30 tablet  2     Pertinent items are noted in HPI.  Blood pressure 127/76, pulse 81, height 5\' 3"  (1.6 m), weight 162 lb (73.483 kg), last menstrual period 02/24/2012. BP 127/76  Pulse 81  Ht 5\' 3"  (1.6 m)  Wt 162 lb (73.483 kg)  BMI 28.70 kg/m2  LMP 02/24/2012 General appearance: alert, cooperative and appears stated age Lungs: clear to auscultation bilaterally Heart: regular rate and rhythm, S1, S2 normal, no murmur, click, rub or gallop Abdomen: soft, non-tender; bowel sounds normal; no masses,  no organomegaly Extremities: extremities normal, atraumatic, no cyanosis or edema   Lab Results  Component Value Date   WBC 6.9 08/30/2012   HGB 10.7* 08/30/2012   HCT 30.3* 08/30/2012   MCV 96.2 08/30/2012   PLT 275 08/30/2012   Lab Results  Component Value Date   PREGTESTUR POSITIVE* 04/27/2012     Assessment/Plan Patient Active Problem List   Diagnosis Date Noted  . Supervision of high-risk pregnancy 06/18/2012    Priority: High  . AMA (advanced maternal age) multigravida  35+ 06/04/2012    Priority: Medium  . Pregnancy complicated by previous preterm labor 06/04/2012    Priority: Medium  . Breech presentation with antenatal problem 11/20/2012  . Asthma 06/04/2012   For ECV with U/s guidance and terbutaline.  PRATT,TANYA S 11/20/2012, 8:48 AM

## 2012-11-22 ENCOUNTER — Encounter (HOSPITAL_COMMUNITY)
Admission: RE | Admit: 2012-11-22 | Discharge: 2012-11-22 | Disposition: A | Discharge status: Home / Self Care | Payer: Medicaid Other | Source: Ambulatory Visit | Attending: Obstetrics & Gynecology | Admitting: Obstetrics & Gynecology

## 2012-11-22 ENCOUNTER — Ambulatory Visit (INDEPENDENT_AMBULATORY_CARE_PROVIDER_SITE_OTHER): Payer: Medicaid Other | Admitting: Obstetrics and Gynecology

## 2012-11-22 ENCOUNTER — Encounter (HOSPITAL_COMMUNITY): Payer: Self-pay

## 2012-11-22 ENCOUNTER — Encounter: Payer: Self-pay | Admitting: Obstetrics and Gynecology

## 2012-11-22 VITALS — BP 120/76 | HR 88 | Resp 16 | Ht 63.0 in | Wt 172.0 lb

## 2012-11-22 VITALS — BP 115/75 | Temp 97.0°F | Wt 171.5 lb

## 2012-11-22 DIAGNOSIS — O09213 Supervision of pregnancy with history of pre-term labor, third trimester: Secondary | ICD-10-CM

## 2012-11-22 DIAGNOSIS — O321XX0 Maternal care for breech presentation, not applicable or unspecified: Secondary | ICD-10-CM

## 2012-11-22 DIAGNOSIS — O321XX1 Maternal care for breech presentation, fetus 1: Secondary | ICD-10-CM

## 2012-11-22 DIAGNOSIS — O09219 Supervision of pregnancy with history of pre-term labor, unspecified trimester: Secondary | ICD-10-CM

## 2012-11-22 DIAGNOSIS — O0990 Supervision of high risk pregnancy, unspecified, unspecified trimester: Secondary | ICD-10-CM

## 2012-11-22 DIAGNOSIS — O09529 Supervision of elderly multigravida, unspecified trimester: Secondary | ICD-10-CM

## 2012-11-22 DIAGNOSIS — O09523 Supervision of elderly multigravida, third trimester: Secondary | ICD-10-CM

## 2012-11-22 DIAGNOSIS — O0993 Supervision of high risk pregnancy, unspecified, third trimester: Secondary | ICD-10-CM

## 2012-11-22 HISTORY — DX: Other specified health status: Z78.9

## 2012-11-22 LAB — CBC
HCT: 33.7 % — ABNORMAL LOW (ref 36.0–46.0)
Hemoglobin: 11.9 g/dL — ABNORMAL LOW (ref 12.0–15.0)
MCH: 34.4 pg — ABNORMAL HIGH (ref 26.0–34.0)
MCHC: 35.3 g/dL (ref 30.0–36.0)
MCV: 97.4 fL (ref 78.0–100.0)
RDW: 14.3 % (ref 11.5–15.5)

## 2012-11-22 LAB — POCT URINALYSIS DIP (DEVICE)
Bilirubin Urine: NEGATIVE
Ketones, ur: NEGATIVE mg/dL
Nitrite: NEGATIVE
Protein, ur: NEGATIVE mg/dL
Urobilinogen, UA: 1 mg/dL (ref 0.0–1.0)
pH: 6.5 (ref 5.0–8.0)

## 2012-11-22 LAB — TYPE AND SCREEN: Antibody Screen: NEGATIVE

## 2012-11-22 LAB — ABO/RH: ABO/RH(D): O POS

## 2012-11-22 NOTE — Patient Instructions (Signed)
Your procedure is scheduled on:11/23/12  Enter through the Main Entrance at :1:00pm Pick up desk phone and dial [phone] and inform us of your arrival.  Please call [phone] if you have any problems the morning of surgery.  Remember: Do not eat food after midnight: tonight Clear liquids are ok until:10:00 am Friday   You may brush your teeth the morning of surgery.  DO NOT wear jewelry, eye make-up, lipstick,body lotion, or dark fingernail polish.  (Polished toes are ok) You may wear deodorant.  If you are to be admitted after surgery, leave suitcase in car until your room has been assigned. Patients discharged on the day of surgery will not be allowed to drive home. Wear loose fitting, comfortable clothes for your ride home.

## 2012-11-22 NOTE — Progress Notes (Signed)
Persistent frank breech presentation

## 2012-11-22 NOTE — Progress Notes (Signed)
P=74,   Used Equities trader.

## 2012-11-22 NOTE — Progress Notes (Signed)
NST reviewed and reactive. Patient without complaints. Fetus in breech presentation. Discussed 1 c/section at 39 weeks vs waiting onset of labor/breech extraction. Following a 30 minute counseling session, patient opted for primary cesarean section. Risks, benefits and alternatives were explained to the patient. All questions were answered.

## 2012-11-23 ENCOUNTER — Encounter (HOSPITAL_COMMUNITY)
Admission: AD | Disposition: A | Discharge status: Home / Self Care | Payer: Self-pay | Source: Ambulatory Visit | Attending: Obstetrics & Gynecology

## 2012-11-23 ENCOUNTER — Encounter (HOSPITAL_COMMUNITY): Payer: Medicaid Other

## 2012-11-23 ENCOUNTER — Encounter (HOSPITAL_COMMUNITY): Payer: Self-pay | Admitting: Pharmacy Technician

## 2012-11-23 ENCOUNTER — Other Ambulatory Visit: Payer: Self-pay | Admitting: Obstetrics and Gynecology

## 2012-11-23 ENCOUNTER — Inpatient Hospital Stay (HOSPITAL_COMMUNITY)
Admission: AD | Admit: 2012-11-23 | Discharge: 2012-11-25 | DRG: 766 | Disposition: A | Discharge status: Home / Self Care | Payer: Medicaid Other | Source: Ambulatory Visit | Attending: Obstetrics & Gynecology | Admitting: Obstetrics & Gynecology

## 2012-11-23 ENCOUNTER — Inpatient Hospital Stay (HOSPITAL_COMMUNITY): Payer: Medicaid Other

## 2012-11-23 ENCOUNTER — Encounter (HOSPITAL_COMMUNITY): Payer: Self-pay | Admitting: *Deleted

## 2012-11-23 DIAGNOSIS — O09529 Supervision of elderly multigravida, unspecified trimester: Secondary | ICD-10-CM

## 2012-11-23 DIAGNOSIS — O321XX0 Maternal care for breech presentation, not applicable or unspecified: Principal | ICD-10-CM | POA: Diagnosis present

## 2012-11-23 SURGERY — Surgical Case
Anesthesia: Spinal | Site: Abdomen | Wound class: Clean Contaminated

## 2012-11-23 MED ORDER — NALBUPHINE SYRINGE 5 MG/0.5 ML
INJECTION | INTRAMUSCULAR | Status: AC
  Filled 2012-11-23: qty 1

## 2012-11-23 MED ORDER — OXYCODONE-ACETAMINOPHEN 5-325 MG PO TABS: 1.0000 | ORAL_TABLET | ORAL | Status: DC | PRN

## 2012-11-23 MED ORDER — ZOLPIDEM TARTRATE 5 MG PO TABS
5.0000 mg | ORAL_TABLET | Freq: Every evening | ORAL | Status: DC | PRN
Start: 2012-11-23 — End: 2012-11-25

## 2012-11-23 MED ORDER — MORPHINE SULFATE 0.5 MG/ML IJ SOLN
INTRAMUSCULAR | Status: AC
  Filled 2012-11-23: qty 10

## 2012-11-23 MED ORDER — OXYTOCIN 10 UNIT/ML IJ SOLN
40.0000 [IU] | INTRAVENOUS | Status: DC | PRN
  Administered 2012-11-23: 40 [IU] via INTRAVENOUS

## 2012-11-23 MED ORDER — SODIUM CHLORIDE 0.9 % IJ SOLN: 3.0000 mL | INTRAMUSCULAR | Status: DC | PRN

## 2012-11-23 MED ORDER — MAGNESIUM HYDROXIDE 400 MG/5ML PO SUSP: 30.0000 mL | ORAL | Status: DC | PRN

## 2012-11-23 MED ORDER — PHENYLEPHRINE HCL 10 MG/ML IJ SOLN
INTRAMUSCULAR | Status: AC
  Filled 2012-11-23: qty 1

## 2012-11-23 MED ORDER — FENTANYL CITRATE 0.05 MG/ML IJ SOLN
INTRAMUSCULAR | Status: AC
  Filled 2012-11-23: qty 5

## 2012-11-23 MED ORDER — SCOPOLAMINE 1 MG/3DAYS TD PT72: 1.0000 | MEDICATED_PATCH | Freq: Once | TRANSDERMAL | Status: DC

## 2012-11-23 MED ORDER — NALOXONE HCL 1 MG/ML IJ SOLN: 1.0000 ug/kg/h | INTRAVENOUS | Status: DC | PRN

## 2012-11-23 MED ORDER — HYDROMORPHONE HCL PF 1 MG/ML IJ SOLN: 0.2500 mg | INTRAMUSCULAR | Status: DC | PRN

## 2012-11-23 MED ORDER — CEFAZOLIN SODIUM-DEXTROSE 2-3 GM-% IV SOLR
2.0000 g | INTRAVENOUS | Status: AC
  Administered 2012-11-23: 2 g via INTRAVENOUS

## 2012-11-23 MED ORDER — DIBUCAINE 1 % RE OINT: 1.0000 "application " | TOPICAL_OINTMENT | RECTAL | Status: DC | PRN

## 2012-11-23 MED ORDER — TETANUS-DIPHTH-ACELL PERTUSSIS 5-2.5-18.5 LF-MCG/0.5 IM SUSP: 0.5000 mL | Freq: Once | INTRAMUSCULAR | Status: DC

## 2012-11-23 MED ORDER — SCOPOLAMINE 1 MG/3DAYS TD PT72
MEDICATED_PATCH | TRANSDERMAL | Status: AC
  Administered 2012-11-23: 1.5 mg via TRANSDERMAL
  Filled 2012-11-23: qty 1

## 2012-11-23 MED ORDER — PHENYLEPHRINE 8 MG IN D5W 100 ML (0.08MG/ML) PREMIX OPTIME
INJECTION | INTRAVENOUS | Status: DC | PRN
  Administered 2012-11-23: 60 ug/min via INTRAVENOUS

## 2012-11-23 MED ORDER — SCOPOLAMINE 1 MG/3DAYS TD PT72
1.0000 | MEDICATED_PATCH | Freq: Once | TRANSDERMAL | Status: DC
  Administered 2012-11-23: 1.5 mg via TRANSDERMAL

## 2012-11-23 MED ORDER — SIMETHICONE 80 MG PO CHEW
80.0000 mg | CHEWABLE_TABLET | ORAL | Status: DC
  Administered 2012-11-24: 80 mg via ORAL
  Filled 2012-11-23 (×2): qty 1

## 2012-11-23 MED ORDER — DIPHENHYDRAMINE HCL 50 MG/ML IJ SOLN: 12.5000 mg | INTRAMUSCULAR | Status: DC | PRN

## 2012-11-23 MED ORDER — ONDANSETRON HCL 4 MG PO TABS: 4.0000 mg | ORAL_TABLET | ORAL | Status: DC | PRN

## 2012-11-23 MED ORDER — NALBUPHINE SYRINGE 5 MG/0.5 ML
INJECTION | INTRAMUSCULAR | Status: AC
  Administered 2012-11-23: 10 mg via SUBCUTANEOUS
  Filled 2012-11-23: qty 0.5

## 2012-11-23 MED ORDER — ONDANSETRON HCL 4 MG/2ML IJ SOLN: 4.0000 mg | INTRAMUSCULAR | Status: DC | PRN

## 2012-11-23 MED ORDER — OXYTOCIN 10 UNIT/ML IJ SOLN
INTRAMUSCULAR | Status: AC
  Filled 2012-11-23: qty 4

## 2012-11-23 MED ORDER — NALBUPHINE HCL 10 MG/ML IJ SOLN: 5.0000 mg | INTRAMUSCULAR | Status: DC | PRN

## 2012-11-23 MED ORDER — FENTANYL CITRATE 0.05 MG/ML IJ SOLN
INTRAMUSCULAR | Status: AC
  Filled 2012-11-23: qty 2

## 2012-11-23 MED ORDER — DIPHENHYDRAMINE HCL 25 MG PO CAPS: 25.0000 mg | ORAL_CAPSULE | ORAL | Status: DC | PRN

## 2012-11-23 MED ORDER — BUPIVACAINE HCL (PF) 0.5 % IJ SOLN
INTRAMUSCULAR | Status: AC
  Filled 2012-11-23: qty 30

## 2012-11-23 MED ORDER — LACTATED RINGERS IV SOLN
INTRAVENOUS | Status: DC | PRN
  Administered 2012-11-23: 16:00:00 via INTRAVENOUS

## 2012-11-23 MED ORDER — KETOROLAC TROMETHAMINE 30 MG/ML IJ SOLN
INTRAMUSCULAR | Status: AC
  Administered 2012-11-23: 30 mg via INTRAVENOUS
  Filled 2012-11-23: qty 1

## 2012-11-23 MED ORDER — LACTATED RINGERS IV SOLN
INTRAVENOUS | Status: DC
  Administered 2012-11-24: via INTRAVENOUS

## 2012-11-23 MED ORDER — FENTANYL CITRATE 0.05 MG/ML IJ SOLN
INTRAMUSCULAR | Status: DC | PRN
  Administered 2012-11-23: 25 ug via INTRATHECAL

## 2012-11-23 MED ORDER — SENNOSIDES-DOCUSATE SODIUM 8.6-50 MG PO TABS
2.0000 | ORAL_TABLET | ORAL | Status: DC
  Administered 2012-11-23 – 2012-11-24 (×2): 2 via ORAL
  Filled 2012-11-23 (×2): qty 2

## 2012-11-23 MED ORDER — BUPIVACAINE HCL (PF) 0.5 % IJ SOLN
INTRAMUSCULAR | Status: DC | PRN
  Administered 2012-11-23: 20 mL

## 2012-11-23 MED ORDER — WITCH HAZEL-GLYCERIN EX PADS: 1.0000 "application " | MEDICATED_PAD | CUTANEOUS | Status: DC | PRN

## 2012-11-23 MED ORDER — DIPHENHYDRAMINE HCL 50 MG/ML IJ SOLN: 25.0000 mg | INTRAMUSCULAR | Status: DC | PRN

## 2012-11-23 MED ORDER — NALOXONE HCL 0.4 MG/ML IJ SOLN: 0.4000 mg | INTRAMUSCULAR | Status: DC | PRN

## 2012-11-23 MED ORDER — IBUPROFEN 600 MG PO TABS
600.0000 mg | ORAL_TABLET | Freq: Four times a day (QID) | ORAL | Status: DC
  Administered 2012-11-23 – 2012-11-25 (×8): 600 mg via ORAL
  Filled 2012-11-23 (×8): qty 1

## 2012-11-23 MED ORDER — ONDANSETRON HCL 4 MG/2ML IJ SOLN
INTRAMUSCULAR | Status: DC | PRN
  Administered 2012-11-23: 4 mg via INTRAVENOUS

## 2012-11-23 MED ORDER — MEPERIDINE HCL 25 MG/ML IJ SOLN: 6.2500 mg | INTRAMUSCULAR | Status: DC | PRN

## 2012-11-23 MED ORDER — KETOROLAC TROMETHAMINE 30 MG/ML IJ SOLN
30.0000 mg | Freq: Once | INTRAMUSCULAR | Status: AC
  Administered 2012-11-23: 30 mg via INTRAVENOUS

## 2012-11-23 MED ORDER — IBUPROFEN 600 MG PO TABS: 600.0000 mg | ORAL_TABLET | Freq: Four times a day (QID) | ORAL | Status: DC

## 2012-11-23 MED ORDER — PRENATAL MULTIVITAMIN CH
1.0000 | ORAL_TABLET | Freq: Every day | ORAL | Status: DC
  Administered 2012-11-24 – 2012-11-25 (×2): 1 via ORAL
  Filled 2012-11-23 (×2): qty 1

## 2012-11-23 MED ORDER — SIMETHICONE 80 MG PO CHEW
80.0000 mg | CHEWABLE_TABLET | ORAL | Status: DC | PRN
  Administered 2012-11-23: 80 mg via ORAL

## 2012-11-23 MED ORDER — LANOLIN HYDROUS EX OINT: 1.0000 "application " | TOPICAL_OINTMENT | CUTANEOUS | Status: DC | PRN

## 2012-11-23 MED ORDER — METOCLOPRAMIDE HCL 5 MG/ML IJ SOLN: 10.0000 mg | Freq: Three times a day (TID) | INTRAMUSCULAR | Status: DC | PRN

## 2012-11-23 MED ORDER — LACTATED RINGERS IV SOLN
INTRAVENOUS | Status: DC
  Administered 2012-11-23: 125 mL/h via INTRAVENOUS
  Administered 2012-11-23 (×2): via INTRAVENOUS

## 2012-11-23 MED ORDER — SIMETHICONE 80 MG PO CHEW
80.0000 mg | CHEWABLE_TABLET | Freq: Three times a day (TID) | ORAL | Status: DC
  Administered 2012-11-23 – 2012-11-25 (×4): 80 mg via ORAL
  Filled 2012-11-23 (×4): qty 1

## 2012-11-23 MED ORDER — ONDANSETRON HCL 4 MG/2ML IJ SOLN: 4.0000 mg | Freq: Three times a day (TID) | INTRAMUSCULAR | Status: DC | PRN

## 2012-11-23 MED ORDER — MEASLES, MUMPS & RUBELLA VAC ~~LOC~~ INJ
0.5000 mL | INJECTION | Freq: Once | SUBCUTANEOUS | Status: DC
  Filled 2012-11-23: qty 0.5

## 2012-11-23 MED ORDER — DIPHENHYDRAMINE HCL 25 MG PO CAPS: 25.0000 mg | ORAL_CAPSULE | Freq: Four times a day (QID) | ORAL | Status: DC | PRN

## 2012-11-23 MED ORDER — NALBUPHINE HCL 10 MG/ML IJ SOLN
5.0000 mg | INTRAMUSCULAR | Status: DC | PRN
  Administered 2012-11-23: 10 mg via SUBCUTANEOUS

## 2012-11-23 MED ORDER — OXYTOCIN 40 UNITS IN LACTATED RINGERS INFUSION - SIMPLE MED: 62.5000 mL/h | INTRAVENOUS | Status: AC

## 2012-11-23 MED ORDER — MORPHINE SULFATE (PF) 0.5 MG/ML IJ SOLN
INTRAMUSCULAR | Status: DC | PRN
  Administered 2012-11-23: .15 mg via INTRATHECAL

## 2012-11-23 MED ORDER — ONDANSETRON HCL 4 MG/2ML IJ SOLN
INTRAMUSCULAR | Status: AC
  Filled 2012-11-23: qty 2

## 2012-11-23 MED ORDER — MENTHOL 3 MG MT LOZG: 1.0000 | LOZENGE | OROMUCOSAL | Status: DC | PRN

## 2012-11-23 SURGICAL SUPPLY — 40 items
BENZOIN TINCTURE PRP APPL 2/3 (GAUZE/BANDAGES/DRESSINGS) ×2 IMPLANT
BINDER ABD UNIV 10 28-50 (GAUZE/BANDAGES/DRESSINGS) ×1 IMPLANT
BINDER ABD UNIV 12 45-62 (WOUND CARE) IMPLANT
BINDER ABDOM UNIV 10 (GAUZE/BANDAGES/DRESSINGS) ×2
BINDER ABDOMINAL 46IN 62IN (WOUND CARE)
CLAMP CORD UMBIL (MISCELLANEOUS) IMPLANT
CLOTH BEACON ORANGE TIMEOUT ST (SAFETY) ×2 IMPLANT
DRAPE LG THREE QUARTER DISP (DRAPES) IMPLANT
DRSG OPSITE POSTOP 4X10 (GAUZE/BANDAGES/DRESSINGS) ×2 IMPLANT
DURAPREP 26ML APPLICATOR (WOUND CARE) ×2 IMPLANT
ELECT REM PT RETURN 9FT ADLT (ELECTROSURGICAL) ×2
ELECTRODE REM PT RTRN 9FT ADLT (ELECTROSURGICAL) ×1 IMPLANT
EXTRACTOR VACUUM M CUP 4 TUBE (SUCTIONS) IMPLANT
GLOVE BIO SURGEON STRL SZ7 (GLOVE) ×2 IMPLANT
GLOVE BIOGEL PI IND STRL 7.0 (GLOVE) ×1 IMPLANT
GLOVE BIOGEL PI INDICATOR 7.0 (GLOVE) ×1
GOWN PREVENTION PLUS XLARGE (GOWN DISPOSABLE) ×2 IMPLANT
GOWN STRL REIN XL XLG (GOWN DISPOSABLE) ×2 IMPLANT
KIT ABG SYR 3ML LUER SLIP (SYRINGE) IMPLANT
NEEDLE HYPO 22GX1.5 SAFETY (NEEDLE) ×2 IMPLANT
NEEDLE HYPO 25X5/8 SAFETYGLIDE (NEEDLE) IMPLANT
NS IRRIG 1000ML POUR BTL (IV SOLUTION) ×2 IMPLANT
PACK C SECTION WH (CUSTOM PROCEDURE TRAY) ×2 IMPLANT
PAD ABD 7.5X8 STRL (GAUZE/BANDAGES/DRESSINGS) ×2 IMPLANT
PAD OB MATERNITY 4.3X12.25 (PERSONAL CARE ITEMS) ×2 IMPLANT
RTRCTR C-SECT PINK 25CM LRG (MISCELLANEOUS) IMPLANT
SPONGE SURGIFOAM ABS GEL 12-7 (HEMOSTASIS) IMPLANT
STAPLER VISISTAT 35W (STAPLE) IMPLANT
STRIP CLOSURE SKIN 1/2X4 (GAUZE/BANDAGES/DRESSINGS) ×2 IMPLANT
SUT PDS AB 0 CTX 60 (SUTURE) IMPLANT
SUT PLAIN 0 NONE (SUTURE) IMPLANT
SUT SILK 0 TIES 10X30 (SUTURE) IMPLANT
SUT VIC AB 0 CT1 36 (SUTURE) ×6 IMPLANT
SUT VIC AB 3-0 CT1 27 (SUTURE) ×1
SUT VIC AB 3-0 CT1 TAPERPNT 27 (SUTURE) ×1 IMPLANT
SUT VIC AB 4-0 KS 27 (SUTURE) IMPLANT
SYR CONTROL 10ML LL (SYRINGE) ×2 IMPLANT
TOWEL OR 17X24 6PK STRL BLUE (TOWEL DISPOSABLE) ×2 IMPLANT
TRAY FOLEY CATH 14FR (SET/KITS/TRAYS/PACK) ×2 IMPLANT
WATER STERILE IRR 1000ML POUR (IV SOLUTION) IMPLANT

## 2012-11-23 NOTE — H&P (Signed)
Monica Blake is a 42 y.o. female [phone] with IUP at [redacted]w[redacted]d presenting for elective primary c/s for fetal malpresentation- frank breech.    Pt states she has been having occasional contractions, associated with none vaginal bleeding.  Membranes are intact, with active fetal movement.    [facility] w[facility]. Pregnancy complicated by AMA and breech status. She had a failed version and was offered the option of laboring breech vs. Elective c/s. Pt elected for c/s.    Prenatal History/Complications:  Past Medical History: Past Medical History  Diagnosis Date  . Preterm labor     1998  . Medical history non-contributory     Past Surgical History: Past Surgical History  Procedure Laterality Date  . Rectal surgery      Obstetrical History: OB History   Grav Para Term Preterm Abortions TAB SAB Ect Mult Living   4 3 2 1      3     G1- term, nsvd,  G2- preterm @30weeks , SVD G3- term, NSVD   Social History: History   Social History  . Marital Status: Divorced    Spouse Name: N/A    Number of Children: N/A  . Years of Education: N/A   Social History Main Topics  . Smoking status: Never Smoker   . Smokeless tobacco: Never Used  . Alcohol Use: No  . Drug Use: No  . Sexual Activity: Not Currently   Other Topics Concern  . Not on file   Social History Narrative  . No narrative on file    Family History: Family History  Problem Relation Age of Onset  . Diabetes Father     Allergies: No Known Allergies  Prescriptions prior to admission  Medication Sig Dispense Refill  . ferrous sulfate (FERROUSUL) 325 (65 FE) MG tablet Take 1 tablet (325 mg total) by mouth daily with breakfast.  60 tablet  5  . flintstones complete (FLINTSTONES) 60 MG chewable tablet Chew 1 tablet by mouth daily.          Review of Systems   Constitutional: Negative for fever, chills, weight loss, malaise/fatigue and diaphoresis.  HENT: Negative for hearing loss, ear pain, nosebleeds,  congestion, sore throat, neck pain, tinnitus and ear discharge.   Eyes: Negative for blurred vision, double vision, photophobia, pain, discharge and redness.  Respiratory: Negative for cough, hemoptysis, sputum production, shortness of breath, wheezing and stridor.   Cardiovascular: Negative for chest pain, palpitations, orthopnea,  leg swelling  Gastrointestinal: Negative for heartburn, nausea, vomiting, diarrhea, constipation, blood in stool Genitourinary: Negative for dysuria, urgency, frequency, hematuria and flank pain.  Musculoskeletal: Negative for myalgias, back pain, joint pain and falls.  Skin: Negative for itching and rash.  Neurological: Negative for dizziness, tingling, tremors, sensory change, speech change, focal weakness, seizures, loss of consciousness, weakness and headaches.  Endo/Heme/Allergies: Negative for environmental allergies and polydipsia. Does not bruise/bleed easily.  Psychiatric/Behavioral: Negative for depression, suicidal ideas, hallucinations, memory loss and substance abuse. The patient is not nervous/anxious and does not have insomnia.       Blood pressure 110/69, pulse 80, temperature 98.1 F (36.7 C), temperature source Oral, resp. rate 18, last menstrual period 02/24/2012, SpO2 100.00%. General appearance: alert, cooperative and no distress Lungs: clear to auscultation bilaterally Heart: regular rate and rhythm Abdomen: soft, non-tender; bowel sounds normal Extremities: Homans sign is negative, no sign of DVT Presentation: breech Fetal monitoring doppler 122      Prenatal labs: ABO, Rh: --/--/O POS, O POS (11/20 1410)  Antibody: NEG (11/20 1410) Rubella:   RPR: NON REACTIVE (11/20 1410)  HBsAg: NEGATIVE (06/02 1650)  HIV: NON REACTIVE (08/28 1003)  GBS: Negative (10/30 0000)  1 hr Glucola 57 Genetic screening  Normal harmony Anatomy US normal   Assessment: Monica Blake is a 42 y.o. [phone] with an IUP at [redacted]w[redacted]d presenting for elective  primary c/s for breech. Breech presentation confirmed with [place] at bedside- head maternal right.   Plan: The risks of cesarean section discussed with the patient included but were not limited to: bleeding which may require transfusion or reoperation; infection which may require antibiotics; injury to bowel, bladder, ureters or other surrounding organs; injury to the fetus; need for additional procedures including hysterectomy in the event of a life-threatening hemorrhage; placental abnormalities wth subsequent pregnancies, incisional problems, thromboembolic phenomenon and other postoperative/anesthesia complications. The patient concurred with the proposed plan, giving informed written consent for the procedure.   Patient has been NPO since last night she will remain NPO for procedure. Anesthesia and OR aware. Preoperative prophylactic antibiotics and SCDs ordered on call to the OR.  To OR when ready.     Vale Haven, MD 11/23/2012, 2:42 PM

## 2012-11-23 NOTE — Op Note (Signed)
Monica Blake PROCEDURE DATE: 11/23/2012  PREOPERATIVE DIAGNOSIS: Intrauterine pregnancy at  [redacted]w[redacted]d weeks gestation; malpresentation: frank breech  POSTOPERATIVE DIAGNOSIS: The same  PROCEDURE: Primary Low Transverse Cesarean Section  SURGEON:  Dr. Eber Jones L. Harraway-Smith  ASSISTANT:  Dr. Rulon Abide   INDICATIONS: Monica Blake is a 42 y.o. [phone] at [redacted]w[redacted]d here for cesarean section secondary to the indications listed under preoperative diagnosis; please see preoperative note for further details.  The risks of cesarean section were discussed with the patient including but were not limited to: bleeding which may require transfusion or reoperation; infection which may require antibiotics; injury to bowel, bladder, ureters or other surrounding organs; injury to the fetus; need for additional procedures including hysterectomy in the event of a life-threatening hemorrhage; placental abnormalities wth subsequent pregnancies, incisional problems, thromboembolic phenomenon and other postoperative/anesthesia complications.   The patient concurred with the proposed plan, giving informed written consent for the procedure.    FINDINGS:  Viable female infant in breech presentation.  Apgars 8 and 9.  Clear amniotic fluid.  Intact placenta, three vessel cord.  Normal uterus, fallopian tubes and ovaries bilaterally.  ANESTHESIA: Spinal INTRAVENOUS FLUIDS: 1000 ml ESTIMATED BLOOD LOSS: 500 ml URINE OUTPUT:  300 ml SPECIMENS: Placenta sent to L&D COMPLICATIONS: None immediate  PROCEDURE IN DETAIL:  The patient preoperatively received intravenous antibiotics and had sequential compression devices applied to her lower extremities.  She was then taken to the operating room where spinal anesthesia was administered and was found to be adequate. She was then placed in a dorsal supine position with a leftward tilt, and prepped and draped in a sterile manner.  A foley catheter was placed into her bladder and  attached to constant gravity.  After an adequate timeout was performed, a Pfannenstiel skin incision was made with scalpel and carried through to the underlying layer of fascia. The fascia was incised in the midline, and this incision was extended bilaterally using the Mayo scissors.  Kocher clamps were applied to the superior aspect of the fascial incision and the underlying rectus muscles were dissected off bluntly. A similar process was carried out on the inferior aspect of the fascial incision. The rectus muscles were separated in the midline bluntly and the peritoneum was entered bluntly. Attention was turned to the lower uterine segment where a low transverse hysterotomy incision was made with a scalpel and extended bilaterally bluntly.  The infant was successfully delivered breech in the usual fashion, the cord was clamped and cut and the infant was handed over to awaiting neonatology team. Uterine massage was then administered, and the placenta delivered intact with a three-vessel cord. The uterus was then cleared of clot and debris.  The hysterotomy was closed with 0 Vicryl in a running locked fashion, and an imbricating layer was also placed with the same suture. The pelvis was cleared of all clot and debris. Hemostasis was confirmed on all surfaces.  The peritoneum and the muscles were reapproximated using 0Vicryl in 1 interrupted suture. The fascia was then closed using 0 Vicryl in a running fashion.  The subcutaneous layer was irrigated, then reapproximated with 3-0 vicryl in a running locked fashion, and the skin was closed with a 4-0 Vicryl subcuticular stitch.  20 cc of 0.5% marcaine was injected into the incision and benzoin and steristrips were applied.  The patient tolerated the procedure well. Sponge, lap, instrument and needle counts were correct x 2.  She was taken to the recovery room in stable condition.

## 2012-11-23 NOTE — Anesthesia Procedure Notes (Signed)

## 2012-11-23 NOTE — H&P (Signed)
Attestation of Attending Supervision of Fellow: Evaluation and management procedures were performed by the Fellow under my supervision and collaboration.  I have reviewed the Fellow's note and chart, and I agree with the management and plan.    

## 2012-11-23 NOTE — Anesthesia Preprocedure Evaluation (Signed)

## 2012-11-23 NOTE — Anesthesia Postprocedure Evaluation (Signed)
  Anesthesia Post-op Note  Patient: Monica Blake  Procedure(s) Performed: Procedure(s): PRIMARY CESAREAN SECTION (N/A)  Patient is awake, responsive, moving her legs, and has signs of resolution of her numbness. Pain and nausea are reasonably well controlled. Vital signs are stable and clinically acceptable. Oxygen saturation is clinically acceptable. There are no apparent anesthetic complications at this time. Patient is ready for discharge.

## 2012-11-23 NOTE — Lactation Note (Signed)
This note was copied from the chart of Monica Blake. Lactation Consultation Note Initial visit at 5 hours of age.  [facility] LC resources given and discussed.  Mom has previous breastfeeding experience with 42 year old.  Hand expression demonstrated with colostrum visible.  Mom reports baby fed for about 30 minutes with in the past hour and did well.  Mom denies pain or problems.  Encouraged to feed with cues and skin to skin.  Baby in visitors arms starting to cue.  Assisted with football position on left breast baby latched well with rhythmic suckling and swallows heard.  Discussed pillow support and described a good latch.  Observed 12+ minutes of breastfeeding well.  Encouraged mom to call for assist as needed.   Patient Name: Monica Blake [id number] Date: 11/23/2012 Reason for consult: Initial assessment   Maternal Data Formula Feeding for Exclusion: No Infant to breast within first hour of birth: Yes Has patient been taught Hand Expression?: Yes Does the patient have breastfeeding experience prior to this delivery?: Yes  Feeding Feeding Type: Breast Fed Length of feed:  (12 minutes observed)  LATCH Score/Interventions Latch: Grasps breast easily, tongue down, lips flanged, rhythmical sucking. Intervention(s): Adjust position;Assist with latch;Breast massage;Breast compression  Audible Swallowing: Spontaneous and intermittent  Type of Nipple: Everted at rest and after stimulation  Comfort (Breast/Nipple): Soft / non-tender     Hold (Positioning): Assistance needed to correctly position infant at breast and maintain latch. Intervention(s): Position options;Skin to skin;Support Pillows;Breastfeeding basics reviewed  LATCH Score: 9  Lactation Tools Discussed/Used     Consult Status Consult Status: Follow-up Date: 11/24/12 Follow-up type: In-patient    Jannifer Rodney 11/23/2012, 9:23 PM

## 2012-11-23 NOTE — Transfer of Care (Signed)
Immediate Anesthesia Transfer of Care Note  Patient: Monica Blake  Procedure(s) Performed: Procedure(s): PRIMARY CESAREAN SECTION (N/A)  Patient Location: PACU  Anesthesia Type:Spinal  Level of Consciousness: awake and alert   Airway & Oxygen Therapy: Patient Spontanous Breathing  Post-op Assessment: Report given to PACU RN  Post vital signs: Reviewed and stable  Complications: No apparent anesthesia complications

## 2012-11-24 LAB — CBC
HCT: 28.9 % — ABNORMAL LOW (ref 36.0–46.0)
Hemoglobin: 10.1 g/dL — ABNORMAL LOW (ref 12.0–15.0)
MCH: 34.6 pg — ABNORMAL HIGH (ref 26.0–34.0)
Platelets: 190 10*3/uL (ref 150–400)
RBC: 2.92 MIL/uL — ABNORMAL LOW (ref 3.87–5.11)
RDW: 14.5 % (ref 11.5–15.5)
WBC: 6.6 10*3/uL (ref 4.0–10.5)

## 2012-11-24 NOTE — Progress Notes (Signed)
Subjective: Postpartum Day 1: Cesarean Delivery Patient reports tolerating PO, + flatus and no problems voiding.    Objective: Vital signs in last 24 hours: Temp:  [97.4 F (36.3 C)-98.1 F (36.7 C)] 97.9 F (36.6 C) (11/22 0340) Pulse Rate:  [61-92] 68 (11/22 0340) Resp:  [14-25] 17 (11/22 0340) BP: (85-116)/(50-69) 116/68 mmHg (11/22 0340) SpO2:  [97 %-100 %] 97 % (11/22 0340) Weight:  [78.019 kg (172 lb)] 78.019 kg (172 lb) (11/21 1830)  Physical Exam:  General: alert, cooperative and no distress Lochia: appropriate Uterine Fundus: firm Incision: healing well, no significant drainage, no dehiscence DVT Evaluation: No evidence of DVT seen on physical exam.   Recent Labs  11/22/12 1410 11/24/12 0630  HGB 11.9* 10.1*  HCT 33.7* 28.9*    Assessment/Plan: Status post Cesarean section. Doing well postoperatively.  Continue current care. Breastfeeding. Wants IUD. Anticipate discharge tomorrow or 11/24.  Bobbye Morton, MD PGY-2, [facility] 11/24/2012, 7:46 AM

## 2012-11-24 NOTE — Progress Notes (Signed)
Subjective: Postpartum Day 1: Cesarean Delivery   Patient is Spanish-speaking,  Spanish interpreter present for this encounter. Patient reports incisional pain, tolerating PO and no problems voiding.  No flatus yet. Breastfeeding.  Objective: Vital signs in last 24 hours: Temp:  [97.4 F (36.3 C)-98.3 F (36.8 C)] 98.3 F (36.8 C) (11/22 0755) Pulse Rate:  [61-92] 62 (11/22 0755) Resp:  [14-25] 16 (11/22 0755) BP: (85-116)/(50-69) 91/52 mmHg (11/22 0755) SpO2:  [96 %-100 %] 96 % (11/22 0755) Weight:  [172 lb (78.019 kg)] 172 lb (78.019 kg) (11/21 1830)  Physical Exam:  General: alert and no distress Lochia: appropriate Uterine Fundus: firm Incision: no significant drainage, C/D/I dressing in place DVT Evaluation: No evidence of DVT seen on physical exam. Negative Homan's sign.  Recent Labs  11/22/12 1410 11/24/12 0630  HGB 11.9* 10.1*  HCT 33.7* 28.9*    Assessment/Plan: Status post Cesarean section. Doing well postoperatively.  Continue current care. Likely discharge to home tomorrow  Tereso Newcomer, MD 11/24/2012, 10:44 AM

## 2012-11-24 NOTE — Anesthesia Postprocedure Evaluation (Signed)
  Anesthesia Post-op Note  Patient: Monica Blake  Procedure(s) Performed: Procedure(s): PRIMARY CESAREAN SECTION (N/A)  Patient Location: PACU and Mother/Baby  Anesthesia Type:Spinal  Level of Consciousness: awake, alert  and oriented  Airway and Oxygen Therapy: Patient Spontanous Breathing  Post-op Pain: mild  Post-op Assessment: Patient's Cardiovascular Status Stable, Respiratory Function Stable, No signs of Nausea or vomiting and Pain level controlled  Post-op Vital Signs: stable  Complications: No apparent anesthesia complications

## 2012-11-25 MED ORDER — IBUPROFEN 600 MG PO TABS: 600.0000 mg | ORAL_TABLET | Freq: Four times a day (QID) | ORAL | Status: DC

## 2012-11-25 MED ORDER — OXYCODONE-ACETAMINOPHEN 5-325 MG PO TABS: 1.0000 | ORAL_TABLET | ORAL | Status: DC | PRN

## 2012-11-25 NOTE — Discharge Summary (Signed)
Attestation of Attending Supervision of Advanced Practitioner (CNM/NP): Evaluation and management procedures were performed by the Advanced Practitioner under my supervision and collaboration. I have reviewed the Advanced Practitioner's note and chart, and I agree with the management and plan.  Gizelle Whetsel H. 12:46 PM   

## 2012-11-25 NOTE — Discharge Summary (Signed)
Obstetric Discharge Summary Reason for Admission: cesarean section- scheduled for frank breech Prenatal Procedures: none Intrapartum Procedures: cesarean: low cervical, transverse Postpartum Procedures: none Complications-Operative and Postpartum: none Hemoglobin  Date Value Range Status  11/24/2012 10.1* 12.0 - 15.0 g/dL Final     HCT  Date Value Range Status  11/24/2012 28.9* 36.0 - 46.0 % Final   [name] is a 42yo [id number] at 39.0wks who was admitted on 11/21 for a scheduled PLTCS for frank breech presentation. She tolerated the procedure well and now on POD#2 is deemed to have received the full benefit of her hospital stay and will be discharged home pending baby's discharge. POD#1 hgb was 10.1 and had been 11.9 preop; VSS and afebrile. She is breastfeeding and plans and IUD for contraception.  Physical Exam:  General: alert, cooperative and mild distress Heart: RRR Lungs: nl effort Lochia: appropriate Uterine Fundus: firm Incision: honeycomb stained but without active bldg DVT Evaluation: No evidence of DVT seen on physical exam.  Discharge Diagnoses: Term Pregnancy-delivered  Discharge Information: Date: 11/25/2012 Activity: pelvic rest Diet: routine Medications: PNV, Ibuprofen and Percocet Condition: stable Instructions: refer to practice specific booklet Discharge to: home Follow-up Information   Follow up with [facility]. Schedule an appointment as soon as possible for a visit in 4 weeks.   Contact information:   [address] [place] [address] [phone]      Newborn Data: Live born female  Birth Weight: 7 lb 2.5 oz (3245 g) APGAR: 8, 9  Home with mother.  Cam Hai 11/25/2012, 10:36 AM

## 2012-11-26 ENCOUNTER — Encounter (HOSPITAL_COMMUNITY): Payer: Self-pay | Admitting: Obstetrics & Gynecology

## 2012-11-26 ENCOUNTER — Other Ambulatory Visit: Payer: Medicaid Other

## 2012-11-26 NOTE — Progress Notes (Signed)
Post discharge chart review completed.  

## 2012-12-04 ENCOUNTER — Encounter: Payer: Self-pay | Admitting: *Deleted

## 2012-12-19 NOTE — Progress Notes (Signed)
NST reviewed and reactive.  

## 2012-12-24 ENCOUNTER — Encounter: Payer: Self-pay | Admitting: Obstetrics and Gynecology

## 2012-12-24 ENCOUNTER — Ambulatory Visit (INDEPENDENT_AMBULATORY_CARE_PROVIDER_SITE_OTHER): Payer: Medicaid Other | Admitting: Obstetrics and Gynecology

## 2012-12-24 DIAGNOSIS — Z01812 Encounter for preprocedural laboratory examination: Secondary | ICD-10-CM

## 2012-12-24 DIAGNOSIS — Z30430 Encounter for insertion of intrauterine contraceptive device: Secondary | ICD-10-CM

## 2012-12-24 DIAGNOSIS — IMO0001 Reserved for inherently not codable concepts without codable children: Secondary | ICD-10-CM

## 2012-12-24 LAB — POCT PREGNANCY, URINE: Preg Test, Ur: NEGATIVE

## 2012-12-24 MED ORDER — LEVONORGESTREL 20 MCG/24HR IU IUD
INTRAUTERINE_SYSTEM | Freq: Once | INTRAUTERINE | Status: AC
  Administered 2012-12-24: 1 via INTRAUTERINE

## 2012-12-24 NOTE — Progress Notes (Signed)
Patient ID: Monica Blake, female   DOB: 09/21/1970, 42 y.o.   MRN: [id number] Pt. States she is both bottle feeding with formula and breastfeeding infant. Has had spotting for the last two weeks.

## 2012-12-24 NOTE — Progress Notes (Signed)
  Subjective:     Monica Blake is a 42 y.o. female who presents for a postpartum visit. She is 4 weeks postpartum following a low cervical transverse Cesarean section. I have fully reviewed the prenatal and intrapartum course. The delivery was at 39 gestational weeks. Outcome: primary cesarean section, low transverse incision. Anesthesia: spinal. Postpartum course has been uncomplicated. Baby's course has been uncomplicated. Baby is feeding by breast. Bleeding staining only. Bowel function is normal. Bladder function is normal. Patient is not sexually active. Contraception method is none. Postpartum depression screening: negative.     Review of Systems A comprehensive review of systems was negative.   Objective:    There were no vitals taken for this visit.  General:  alert, cooperative and no distress   Breasts:  inspection negative, no nipple discharge or bleeding, no masses or nodularity palpable  Lungs: clear to auscultation bilaterally  Heart:  regular rate and rhythm  Abdomen: soft, non-tender; bowel sounds normal; no masses,  no organomegaly Incision: healed completely. No erythema, induration or drainage   Vulva:  normal  Vagina: normal vagina, no discharge, exudate, lesion, or erythema  Cervix:  multiparous appearance  Corpus: normal size, contour, position, consistency, mobility, non-tender  Adnexa:  no mass, fullness, tenderness  Rectal Exam: Not performed.        Assessment:     Normal postpartum exam. Pap smear not done at today's visit.   Plan:    1. Contraception: IUD 2. IUD Procedure Note Patient identified, informed consent performed, signed copy in chart, time out was performed.  Urine pregnancy test negative.  Speculum placed in the vagina.  Cervix visualized.  Cleaned with Betadine x 2.  Grasped anteriorly with a single tooth tenaculum.  Uterus sounded to 8 cm.  Mirena IUD placed per manufacturer's recommendations.  Strings trimmed to 3 cm. Tenaculum was  removed, good hemostasis noted.  Patient tolerated procedure well.   Patient given post procedure instructions and Mirena care card with expiration date.  Patient is asked to check IUD strings periodically and follow up in 4-6 weeks for IUD check.   3. Follow up in: 4 weeks or as needed.

## 2012-12-25 ENCOUNTER — Encounter: Payer: Self-pay | Admitting: *Deleted

## 2013-01-09 ENCOUNTER — Encounter: Payer: Self-pay | Admitting: *Deleted

## 2013-01-21 ENCOUNTER — Encounter: Payer: Self-pay | Admitting: Obstetrics and Gynecology

## 2013-01-23 ENCOUNTER — Encounter: Payer: Self-pay | Admitting: Obstetrics and Gynecology

## 2013-11-04 ENCOUNTER — Encounter: Payer: Self-pay | Admitting: Obstetrics and Gynecology

## 2018-02-27 ENCOUNTER — Other Ambulatory Visit (HOSPITAL_COMMUNITY)
Admission: RE | Admit: 2018-02-27 | Discharge: 2018-02-27 | Disposition: A | Discharge status: Home / Self Care | Payer: Medicaid Other | Source: Ambulatory Visit | Attending: Nurse Practitioner | Admitting: Nurse Practitioner

## 2018-02-27 ENCOUNTER — Ambulatory Visit (INDEPENDENT_AMBULATORY_CARE_PROVIDER_SITE_OTHER): Payer: Medicaid Other | Admitting: Nurse Practitioner

## 2018-02-27 ENCOUNTER — Encounter: Payer: Self-pay | Admitting: Nurse Practitioner

## 2018-02-27 VITALS — BP 125/81 | HR 69 | Wt 159.5 lb

## 2018-02-27 DIAGNOSIS — Z975 Presence of (intrauterine) contraceptive device: Secondary | ICD-10-CM | POA: Insufficient documentation

## 2018-02-27 DIAGNOSIS — Z0000 Encounter for general adult medical examination without abnormal findings: Secondary | ICD-10-CM | POA: Diagnosis present

## 2018-02-27 DIAGNOSIS — Z30433 Encounter for removal and reinsertion of intrauterine contraceptive device: Secondary | ICD-10-CM | POA: Diagnosis not present

## 2018-02-27 DIAGNOSIS — Z30430 Encounter for insertion of intrauterine contraceptive device: Secondary | ICD-10-CM | POA: Diagnosis not present

## 2018-02-27 DIAGNOSIS — Z01419 Encounter for gynecological examination (general) (routine) without abnormal findings: Secondary | ICD-10-CM

## 2018-02-27 MED ORDER — PARAGARD INTRAUTERINE COPPER IU IUD
INTRAUTERINE_SYSTEM | Freq: Once | INTRAUTERINE | Status: AC
  Administered 2018-02-27: 17:00:00 via INTRAUTERINE

## 2018-02-27 NOTE — Progress Notes (Signed)
GYNECOLOGY ANNUAL PREVENTATIVE CARE ENCOUNTER NOTE  Subjective:   Monica Blake is a 48 y.o. [phone] female here for a routine annual gynecologic exam.  Current complaints: IUD Mirena has been in place for 5 years. Has had a Paragard before and prefers the Paragard as she wants to see when she has periods.  Denies abnormal vaginal bleeding, discharge, pelvic pain, problems with intercourse or other gynecologic concerns.    Gynecologic History No LMP recorded. (Menstrual status: IUD). Contraception: IUD Last Pap: 2014. Results were: normal Last mammogram: has never had.   Obstetric History OB History  Gravida Para Term Preterm AB Living  SAB TAB Ectopic Multiple Live Births          4    # Outcome Date GA Lbr Len/2nd Weight Sex Delivery Anes PTL Lv  4 Term 11/23/12 [redacted]w[redacted]d   F CS-LTranv Spinal  LIV  3 Term 11/24/98 [redacted]w[redacted]d  8 lb (3.629 kg) M Vag-Spont None  LIV  2 Preterm 07/13/96 [redacted]w[redacted]d  3 lb (1.361 kg) M Vag-Spont None Y LIV  1 Term 09/14/94 [redacted]w[redacted]d  7 lb (3.175 kg) F Vag-Spont None  LIV    Past Medical History:  Diagnosis Date  . Medical history non-contributory   . Preterm labor    1998    Past Surgical History:  Procedure Laterality Date  . CESAREAN SECTION N/A 11/23/2012   Procedure: PRIMARY CESAREAN SECTION;  Surgeon: Willodean Rosenthal, MD;  Location: [facility];  Service: Obstetrics;  Laterality: N/A;  . RECTAL SURGERY      Current Outpatient Medications on File Prior to Visit  Medication Sig Dispense Refill  . flintstones complete (FLINTSTONES) 60 MG chewable tablet Chew 1 tablet by mouth daily.     Marland Kitchen ibuprofen (ADVIL,MOTRIN) 600 MG tablet Take 1 tablet (600 mg total) by mouth every 6 (six) hours. (Patient not taking: Reported on 02/27/2018) 50 tablet 1  . oxyCODONE-acetaminophen (PERCOCET/ROXICET) 5-325 MG per tablet Take 1-2 tablets by mouth every 4 (four) hours as needed for severe pain (moderate - severe pain). (Patient not taking: Reported on  02/27/2018) 50 tablet 0   No current facility-administered medications on file prior to visit.     No Known Allergies  Social History   Socioeconomic History  . Marital status: Divorced    Spouse name: Not on file  . Number of children: Not on file  . Years of education: Not on file  . Highest education level: Not on file  Occupational History  . Not on file  Social Needs  . Financial resource strain: Not on file  . Food insecurity:    Worry: Not on file    Inability: Not on file  . Transportation needs:    Medical: Not on file    Non-medical: Not on file  Tobacco Use  . Smoking status: Never Smoker  . Smokeless tobacco: Never Used  Substance and Sexual Activity  . Alcohol use: No  . Drug use: No  . Sexual activity: Not Currently  Lifestyle  . Physical activity:    Days per week: Not on file    Minutes per session: Not on file  . Stress: Not on file  Relationships  . Social connections:    Talks on phone: Not on file    Gets together: Not on file    Attends religious service: Not on file    Active member of club or organization: Not on file  Attends meetings of clubs or organizations: Not on file    Relationship status: Not on file  . Intimate partner violence:    Fear of current or ex partner: Not on file    Emotionally abused: Not on file    Physically abused: Not on file    Forced sexual activity: Not on file  Other Topics Concern  . Not on file  Social History Narrative  . Not on file    Family History  Problem Relation Age of Onset  . Diabetes Father     The following portions of the patient's history were reviewed and updated as appropriate: allergies, current medications, past family history, past medical history, past social history, past surgical history and problem list.  Review of Systems Pertinent items noted in HPI and remainder of comprehensive ROS otherwise negative.   Objective:  BP 125/81   Pulse 69   Wt 159 lb 8 oz (72.3 kg)    BMI 27.38 kg/m  CONSTITUTIONAL: Well-developed, well-nourished female in no acute distress.  HENT:  Normocephalic, atraumatic, External right and left ear normal.  EYES: Conjunctivae and EOM are normal. Pupils are equal, round.  No scleral icterus.  NECK: Normal range of motion, supple, no masses.  Normal thyroid.  SKIN: Skin is warm and dry. No rash noted. Not diaphoretic. No erythema. No pallor. NEUROLOGIC: Alert and oriented to person, place, and time. Normal reflexes, muscle tone coordination. No cranial nerve deficit noted. PSYCHIATRIC: Normal mood and affect. Normal behavior. Normal judgment and thought content. CARDIOVASCULAR: Normal heart rate noted, regular rhythm RESPIRATORY: Clear to auscultation bilaterally. Effort and breath sounds normal, no problems with respiration noted. BREASTS: Symmetric in size. No masses, skin changes, nipple drainage, or lymphadenopathy. One unchanged geographic nevus noted on left breast. ABDOMEN: Soft, no distention noted.  No tenderness, rebound or guarding.  PELVIC: Normal appearing external genitalia; normal appearing vaginal mucosa and cervix.  No abnormal discharge noted.  Pap smear obtained.  Normal uterine size, no other palpable masses, no uterine or adnexal tenderness. MUSCULOSKELETAL: Normal range of motion. No tenderness.  No cyanosis, clubbing, or edema.    IUD Insertion Procedure Note Patient identified, informed consent performed, consent signed.   Discussed risks of irregular bleeding, cramping, infection, malpositioning or misplacement of the IUD outside the uterus which may require further procedure such as laparoscopy. Time out was performed.  Urine pregnancy test not done as Mirena IUD removed without difficulty prior to insertion.  Speculum placed in the vagina.  Cervix visualized.  pap done.  IUD removed.  Cleaned with Betadine x 2.  Hurricane spray used.  Grasped anteriorly with a single tooth tenaculum.  Uterus sounded to 6 cm.   Paragard IUD placed per manufacturer's recommendations.  Strings trimmed to 3 cm. Tenaculum was removed, good hemostasis noted.  Patient tolerated procedure well.   Patient was given post-procedure instructions.  She was advised to have backup contraception for one week.  Patient was also asked to check IUD strings periodically and follow up in 4 weeks for IUD check.   Assessment and Plan:  1. Encounter for annual routine gynecological examination Has never had mammogram and ordered - will schedule tomorrow.  2. Contraception, device intrauterine removed and reinserted - paragard IUD - Cytology - PAP( [facility])  Will follow up results of pap smear and manage accordingly. Mammogram ordered and will be scheduled tomorrow. Routine preventative health maintenance measures emphasized. Please refer to After Visit Summary for other counseling recommendations.      Marvetta Gibbons, RN, MSN, NP-BC Nurse Practitioner, [facility]

## 2018-02-27 NOTE — Patient Instructions (Signed)
Menopause Menopause is the normal time of life when menstrual periods stop completely. It is usually confirmed by 12 months without a menstrual period. The transition to menopause (perimenopause) most often happens between the ages of 45 and 55. During perimenopause, hormone levels change in your body, which can cause symptoms and affect your health. Menopause may increase your risk for:  Loss of bone (osteoporosis), which causes bone breaks (fractures).  Depression.  Hardening and narrowing of the arteries (atherosclerosis), which can cause heart attacks and strokes. What are the causes? This condition is usually caused by a natural change in hormone levels that happens as you get older. The condition may also be caused by surgery to remove both ovaries (bilateral oophorectomy). What increases the risk? This condition is more likely to start at an earlier age if you have certain medical conditions or treatments, including:  A tumor of the pituitary gland in the brain.  A disease that affects the ovaries and hormone production.  Radiation treatment for cancer.  Certain cancer treatments, such as chemotherapy or hormone (anti-estrogen) therapy.  Heavy smoking and excessive alcohol use.  Family history of early menopause. This condition is also more likely to develop earlier in women who are very thin. What are the signs or symptoms? Symptoms of this condition include:  Hot flashes.  Irregular menstrual periods.  Night sweats.  Changes in feelings about sex. This could be a decrease in sex drive or an increased comfort around your sexuality.  Vaginal dryness and thinning of the vaginal walls. This may cause painful intercourse.  Dryness of the skin and development of wrinkles.  Headaches.  Problems sleeping (insomnia).  Mood swings or irritability.  Memory problems.  Weight gain.  Hair growth on the face and chest.  Bladder infections or problems with urinating. How  is this diagnosed? This condition is diagnosed based on your medical history, a physical exam, your age, your menstrual history, and your symptoms. Hormone tests may also be done. How is this treated? In some cases, no treatment is needed. You and your health care provider should make a decision together about whether treatment is necessary. Treatment will be based on your individual condition and preferences. Treatment for this condition focuses on managing symptoms. Treatment may include:  Menopausal hormone therapy (MHT).  Medicines to treat specific symptoms or complications.  Acupuncture.  Vitamin or herbal supplements. Before starting treatment, make sure to let your health care provider know if you have a personal or family history of:  Heart disease.  Breast cancer.  Blood clots.  Diabetes.  Osteoporosis. Follow these instructions at home: Lifestyle  Do not use any products that contain nicotine or tobacco, such as cigarettes and e-cigarettes. If you need help quitting, ask your health care provider.  Get at least 30 minutes of physical activity on 5 or more days each week.  Avoid alcoholic and caffeinated beverages, as well as spicy foods. This may help prevent hot flashes.  Get 7-8 hours of sleep each night.  If you have hot flashes, try: ? Dressing in layers. ? Avoiding things that may trigger hot flashes, such as spicy food, warm places, or stress. ? Taking slow, deep breaths when a hot flash starts. ? Keeping a fan in your home and office.  Find ways to manage stress, such as deep breathing, meditation, or journaling.  Consider going to group therapy with other women who are having menopause symptoms. Ask your health care provider about recommended group therapy meetings. Eating and   drinking  Eat a healthy, balanced diet that contains whole grains, lean protein, low-fat dairy, and plenty of fruits and vegetables.  Your health care provider may recommend  adding more soy to your diet. Foods that contain soy include tofu, tempeh, and soy milk.  Eat plenty of foods that contain calcium and vitamin D for bone health. Items that are rich in calcium include low-fat milk, yogurt, beans, almonds, sardines, broccoli, and kale. Medicines  Take over-the-counter and prescription medicines only as told by your health care provider.  Talk with your health care provider before starting any herbal supplements. If prescribed, take vitamins and supplements as told by your health care provider. These may include: ? Calcium. Women age 48 and older should get 1,200 mg (milligrams) of calcium every day. ? Vitamin D. Women need 600-800 International Units of vitamin D each day. ? Vitamins B12 and B6. Aim for 50 micrograms of B12 and 1.5 mg of B6 each day. General instructions  Keep track of your menstrual periods, including: ? When they occur. ? How heavy they are and how long they last. ? How much time passes between periods.  Keep track of your symptoms, noting when they start, how often you have them, and how long they last.  Use vaginal lubricants or moisturizers to help with vaginal dryness and improve comfort during sex.  Keep all follow-up visits as told by your health care provider. This is important. This includes any group therapy or counseling. Contact a health care provider if:  You are still having menstrual periods after age 48.  You have pain during sex.  You have not had a period for 12 months and you develop vaginal bleeding. Get help right away if:  You have: ? Severe depression. ? Excessive vaginal bleeding. ? Pain when you urinate. ? A fast or irregular heart beat (palpitations). ? Severe headaches. ? Abdomen (abdominal) pain or severe indigestion.  You fell and you think you have a broken bone.  You develop leg or chest pain.  You develop vision problems.  You feel a lump in your breast. Summary  Menopause is the normal  time of life when menstrual periods stop completely. It is usually confirmed by 12 months without a menstrual period.  The transition to menopause (perimenopause) most often happens between the ages of 745 and 7455.  Symptoms can be managed through medicines, lifestyle changes, and complementary therapies such as acupuncture.  Eat a balanced diet that is rich in nutrients to promote bone health and heart health and to manage symptoms during menopause. This information is not intended to replace advice given to you by your health care provider. Make sure you discuss any questions you have with your health care provider. Document Released: 03/12/2003 Document Revised: 01/23/2016 Document Reviewed: 01/23/2016 Elsevier Interactive Patient Education  2019 [employer]  La menopausia Menopause La menopausia es el perodo normal de la vida en el que los perodos menstruales cesan por completo. Por lo general, se confirma tras 12meses de no haber tenido un perodo menstrual. La transicin a la menopausia (perimenopausia), con mayor frecuencia, ocurre entre los 45 y los 55aos. Durante la perimenopausia, los niveles hormonales cambian en el [place]organismo, lo que puede provocar sntomas y Ship brokerafectar la salud. La menopausia podra aumentar el riesgo de sufrir lo siguiente:  Prdida de masa sea (osteoporosis), lo que predispone a la rotura de huesos (fracturas).  Depresin.  Endurecimiento y Scientist, research (medical)estrechamiento de las arterias (aterosclerosis), lo que puede causar infartos de miocardio y  accidentes cerebrovasculares. Cules son las causas? A menudo, la causa de esta afeccin es un cambio natural en los niveles hormonales, que ocurre a medida que envejece. La afeccin tambin podra ser provocada por una ciruga en la que se extraigan ambos ovarios (ooforectoma bilateral). Qu incrementa el riesgo? Es ms probable que esta afeccin comience a una temprana edad si tiene ciertas afecciones o se realiza ciertos  tratamientos, incluidos los siguientes:  Un tumor en la hipfisis del cerebro.  Una enfermedad que afecte los ovarios y la produccin de hormonas.  Radioterapia para tratar Management consultant.  Ciertos tratamientos contra el cncer, como quimioterapia o terapia hormonal (antiestrgeno).  Fumar mucho y consumir alcohol de forma excesiva.  Antecedentes familiares de menopausia temprana. Adems, es ms probable que esta afeccin se presente de manera temprana en las mujeres que son [place]. Cules son los signos o los sntomas? Los sntomas de esta afeccin incluyen los siguientes:  Engineer, maintenance.  Perodos menstruales irregulares.  Sudoracin nocturna.  Cambios en el sentimiento respecto de las [employer]. Es posible que el deseo sexual disminuya o que se sienta ms cmoda respecto de su sexualidad.  Sequedad vaginal y adelgazamiento de las paredes de la vagina. Esto podra hacer que sienta dolor durante las relaciones sexuales.  Sequedad de la piel y aparicin de Banker.  Dolores de [place].  Problemas para dormir (insomnio).  Cambios de humor o irritabilidad.  Problemas de memoria.  Aumento de [place].  Crecimiento de bello en la cara y el pecho.  Infecciones en la vejiga o problemas para orinar. Cmo se diagnostica? Esta afeccin se diagnostica en funcin de los antecedentes mdicos, un examen fsico, la edad, los antecedentes menstruales y los sntomas. Tambin le podran realizar estudios hormonales. Cmo se trata? En algunos los casos, no se necesita tratamiento. Usted y el mdico deben decidir juntos si se debe Pensions consultant. El tratamiento se determinar en funcin de su cuadro clnico y de sus preferencias. El tratamiento de este cuadro clnico se centra en el control de los sntomas. El tratamiento puede incluir lo siguiente:  Terapia hormonal para la menopausia.  Medicamentos para tratar sntomas o complicaciones  especficos.  Acupuntura.  Vitaminas o suplementos herbales. Antes de Microbiologist, es importante que le avise al mdico si tiene antecedentes personales o familiares de lo siguiente:  Enfermedades cardacas.  Cncer de mama.  Cogulos de [place].  Diabetes.  Osteoporosis. Siga estas indicaciones en su casa: Estilo de vida  No consuma ningn producto que contenga nicotina o tabaco, como cigarrillos y Administrator, Civil Service. Si necesita ayuda para dejar de fumar, consulte al mdico.  Realice, por lo menos, de actividad fsica 5das por semana o ms.  Evite las bebidas con alcohol o cafena, as como las [employer] Esto podra ayudar a prevenir los acaloramientos.  Intente dormir de 7 a 8horas todas las noches.  Si tiene acaloramientos: ? Vstase en capas. ? Evite las cosas que podran [employer] acaloramientos, como las comidas muy condimentadas, los lugares calientes o el estrs. ? Respire profundamente y despacio cuando comience a Scientist, water quality. ? Tenga un ventilador en su casa y en su oficina.  Encuentre modos de [employer], por ejemplo, a travs de la respiracin, meditacin o un diario ntimo.  Considere la posibilidad de asistir a terapia grupal con otras mujeres que tengan sntomas de [place]. Pdale recomendaciones al mdico sobre reuniones de terapia grupal. Comida y bebida  Siga una dieta saludable y equilibrada que incluya cereales  integrales, protenas magras, productos lcteos descremados y muchas frutas y verduras.  El mdico podra recomendarle que agregue una mayor cantidad de soja a su dieta. Algunos de los alimentos que contienen soja son el tofu, el tempeh y la [place] de soja.  Consuma muchos alimentos que contengan calcio y vitaminaD para Nutritional therapist salud sea. Algunos productos que contienen mucho calcio son los [employer], el yogur, los frijoles, las [place], las sardinas, el  brcoli y la col rizada. Medicamentos  [employer] de venta libre y los recetados solamente como se lo haya indicado el mdico.  Hable con el mdico antes de Corporate investment banker a tomar cualquier suplemento herbal. Tome las vitaminas y los suplementos recetados como se lo haya indicado el mdico. Estos pueden incluir lo siguiente: ? Calcio. Las mujeres de 51aos o ms deben consumir 1200mg  (miligramos) de [employer]. ? VitaminaD. Las mujeres necesitan entre 600 y 800unidades internacionales de vitaminaD todos [place]. ? VitaminasB12 yB6. Procure consumir de vitaminaB12 y 1,5mg  de vitaminaB6 todos los [address]  [address]. Instrucciones generales  Lleve un registro de sus perodos menstruales; incluya lo siguiente: ? El momento en que ocurren. ? Qu tan abundantes son y cunto duran. ? Cunto tiempo transcurre entre cada perodo menstrual.  Lleve un registro de los sntomas; anote cundo comienzan, con qu frecuencia ocurren y cunto duran.  Use lubricantes o humectantes vaginales para aliviar la sequedad vaginal y Personnel officer durante las relaciones sexuales.  Concurra a todas las visitas de control como se lo haya indicado el mdico. Esto es importante. Esto incluye la terapia grupal y la psicoterapia. Comunquese con un mdico si:  An tiene perodos menstruales despus de los 55aos.  Siente dolor durante las [employer].  No tuvo perodos menstruales durante los ltimos y presenta sangrado vaginal. Solicite ayuda de inmediato si:  Tiene los siguientes sntomas: ? Depresin grave. ? Sangrado vaginal excesivo. ? Dolor al [employer]. ? Latidos cardacos rpidos o irregulares (palpitaciones). ? Dolor de cabeza intenso. ? Dolor en el abdomen (abdominal) o dispepsia grave.  Se cay y cree que se ha fracturado un hueso.  Siente dolor en el pecho o en la pierna.  Desarrolla problemas de visin.  Se toca un bulto en la  mama. Resumen  La menopausia es el perodo normal de la vida en el que los perodos menstruales cesan por completo. Por lo general, se confirma tras de no haber tenido un perodo menstrual.  La transicin a la menopausia (perimenopausia), con mayor frecuencia, ocurre entre los 45 y los 55aos.  Los sntomas pueden controlarse mediante medicamentos, cambios en el estilo de vida y terapias complementarias, como acupuntura.  Siga una dieta equilibrada que incluya muchos nutrientes para Biochemist, clinical salud sea y la salud cardaca, y para Chief Operating Officer los sntomas durante la [place]. Esta informacin no tiene Theme park manager el consejo del mdico. Asegrese de hacerle al mdico cualquier pregunta que tenga. Document Released: 01/31/2006 Document Revised: 09/15/2016 Document Reviewed: 09/15/2016 Elsevier Interactive Patient Education  2019 [employer].

## 2018-03-05 LAB — CYTOLOGY - PAP
CHLAMYDIA, DNA PROBE: NEGATIVE
Diagnosis: NEGATIVE
HPV: NOT DETECTED
Neisseria Gonorrhea: NEGATIVE

## 2018-03-28 ENCOUNTER — Other Ambulatory Visit: Payer: Self-pay

## 2018-03-28 ENCOUNTER — Ambulatory Visit: Payer: Medicaid Other | Admitting: Nurse Practitioner

## 2018-03-28 DIAGNOSIS — Z975 Presence of (intrauterine) contraceptive device: Secondary | ICD-10-CM

## 2018-03-28 NOTE — Progress Notes (Signed)
   TELEHEALTH VIRTUAL GYNECOLOGY VISIT ENCOUNTER NOTE  I connected with Burman Foster on 03/28/18 at  8:15 AM EDT by telephone at home and verified that I am speaking with the correct person using two identifiers.  Appointment was at 8:15 am - call made via phone line interpreter at 8:42 am.  Number was busy - two attempts made.  9:55 am 2 attempts made to call with interpreter - no answer.  Emergency number called and referred back to number we have been calling.  No phone visit done today.   Currie Paris, NP 11:29 am Center for [employer], [facility]

## 2018-03-28 NOTE — Patient Instructions (Signed)
covi

## 2018-03-30 ENCOUNTER — Encounter: Payer: Self-pay | Admitting: *Deleted

## 2018-07-20 ENCOUNTER — Other Ambulatory Visit: Payer: Self-pay

## 2018-07-20 ENCOUNTER — Encounter (HOSPITAL_COMMUNITY): Payer: Self-pay

## 2018-07-20 ENCOUNTER — Ambulatory Visit (HOSPITAL_COMMUNITY)
Admission: EM | Admit: 2018-07-20 | Discharge: 2018-07-20 | Disposition: A | Discharge status: Home / Self Care | Payer: Medicaid Other | Attending: Urgent Care | Admitting: Urgent Care

## 2018-07-20 DIAGNOSIS — B349 Viral infection, unspecified: Secondary | ICD-10-CM

## 2018-07-20 DIAGNOSIS — J4530 Mild persistent asthma, uncomplicated: Secondary | ICD-10-CM

## 2018-07-20 DIAGNOSIS — Z20828 Contact with and (suspected) exposure to other viral communicable diseases: Secondary | ICD-10-CM

## 2018-07-20 DIAGNOSIS — Z20822 Contact with and (suspected) exposure to covid-19: Secondary | ICD-10-CM

## 2018-07-20 DIAGNOSIS — J3089 Other allergic rhinitis: Secondary | ICD-10-CM

## 2018-07-20 NOTE — Discharge Instructions (Signed)
We will manage this as a viral syndrome. For sore throat or cough try using a honey-based tea. Use 3 teaspoons of honey with juice squeezed from half lemon. Place shaved pieces of ginger into 1/2-1 cup of water and warm over stove top. Then mix the ingredients and repeat every 4 hours as needed. Please take Tylenol 500mg  every 6 hours. Hydrate very well with at least 2 liters of water. Eat light meals such as soups to replenish electrolytes and soft fruits, veggies. Start an antihistamine like Zyrtec, Allegra or Claritin for postnasal drainage, sinus congestion.  You can take this together with pseudoephedrine (Sudafed) at a dose of 60 mg 3 times a day oral 120 mg twice daily as needed for the same kind of congestion.  However, do not take Sudafed if you have high blood pressure or are prone to palpitations, have abnormal heart rhythms.   Para el dolor de garganta o tos intente usar un t a base de miel. Use 3 cucharaditas de miel con jugo exprimido de [employer]. Coloque trozos de Pension scheme manager en 1 / 2-1 taza de agua y caliente sobre la estufa. Luego mezcle los ingredientes y repita cada 4 horas segn sea necesario. Tome Tylenol 500 mg cada 6 horas para dolor. Hidrata muy bien con al menos 2 litros de [place]. Coma comidas ligeras como sopas para [employer] electrolitos y coma frutas suaves, verduras. Comience un antihistamnico como Zyrtec, Allegra o Claritin. Puede recoger pseudoefedrina de venta libre (Sudafed) y usarla para el drenaje posnasal, la congestin nasal a una dosis de 60 mg cada 8 horas o 120 mg cada 12 horas, segn sea necesario. Use el jarabe por la noche para su tos y las capsulas durante el dia.

## 2018-07-20 NOTE — ED Provider Notes (Signed)
MRN: [id number] DOB: 10-Nov-1970  Subjective:   Monica Blake is a 48 y.o. female presenting for 4 day history of mild persistent daily headaches and mild throat pain. Has used APAP.  Has had exposure to family member that tested positive for COVID-19.  Takes Zyrtec for allergies, albuterol inhaler.    No Known Allergies  Past Medical History:  Diagnosis Date  . Medical history non-contributory   . Preterm labor    1998     Past Surgical History:  Procedure Laterality Date  . CESAREAN SECTION N/A 11/23/2012   Procedure: PRIMARY CESAREAN SECTION;  Surgeon: Lavonia Drafts, MD;  Location: [facility];  Service: Obstetrics;  Laterality: N/A;  . RECTAL SURGERY      Review of Systems  Constitutional: Negative for fever and malaise/fatigue.  HENT: Positive for sore throat. Negative for congestion, ear pain and sinus pain.   Eyes: Negative for blurred vision, double vision, discharge and redness.  Respiratory: Negative for cough, hemoptysis, shortness of breath and wheezing.   Cardiovascular: Negative for chest pain.  Gastrointestinal: Negative for abdominal pain, diarrhea, nausea and vomiting.  Genitourinary: Negative for dysuria, flank pain and hematuria.  Musculoskeletal: Negative for myalgias.  Skin: Negative for rash.  Neurological: Positive for headaches. Negative for dizziness and weakness.  Endo/Heme/Allergies: Positive for environmental allergies.  Psychiatric/Behavioral: Negative for depression and substance abuse.    Objective:   Vitals: BP 124/70 (BP Location: Left Arm)   Pulse 69   Temp 97.9 F (36.6 C) (Oral)   Resp 17   SpO2 100%   Physical Exam Constitutional:      General: She is not in acute distress.    Appearance: Normal appearance. She is well-developed. She is not ill-appearing, toxic-appearing or diaphoretic.  HENT:     Head: Normocephalic and atraumatic.     Right Ear: Tympanic membrane and ear canal normal. No drainage or tenderness. No  middle ear effusion. Tympanic membrane is not erythematous.     Left Ear: Tympanic membrane and ear canal normal. No drainage or tenderness.  No middle ear effusion. Tympanic membrane is not erythematous.     Nose: Nose normal. No congestion or rhinorrhea.     Mouth/Throat:     Mouth: Mucous membranes are moist. No oral lesions.     Pharynx: Oropharynx is clear. No pharyngeal swelling, oropharyngeal exudate, posterior oropharyngeal erythema or uvula swelling.     Tonsils: No tonsillar exudate or tonsillar abscesses.  Eyes:     Extraocular Movements: Extraocular movements intact.     Right eye: Normal extraocular motion.     Left eye: Normal extraocular motion.     Conjunctiva/sclera: Conjunctivae normal.     Pupils: Pupils are equal, round, and reactive to light.  Neck:     Musculoskeletal: Normal range of motion and neck supple.  Cardiovascular:     Rate and Rhythm: Normal rate and regular rhythm.     Pulses: Normal pulses.     Heart sounds: Normal heart sounds. No murmur. No friction rub. No gallop.   Pulmonary:     Effort: Pulmonary effort is normal. No respiratory distress.     Breath sounds: Normal breath sounds. No stridor. No wheezing, rhonchi or rales.  Lymphadenopathy:     Cervical: No cervical adenopathy.  Skin:    General: Skin is warm and dry.     Findings: No rash.  Neurological:     General: No focal deficit present.     Mental Status: She is alert and oriented  to person, place, and time.  Psychiatric:        Mood and Affect: Mood normal.        Behavior: Behavior normal.        Thought Content: Thought content normal.       Assessment and Plan :   1. Viral illness   2. Exposure to Covid-19 Virus   3. Allergic rhinitis due to other allergic trigger, unspecified seasonality   4. Mild persistent asthma without complication     We will manage for viral syndrome.  Counseled patient on nature of COVID-19 including modes of transmission, diagnostic testing,  management and supportive care.  Offered symptomatic relief. COVID 19 testing is pending. Counseled patient on potential for adverse effects with medications prescribed/recommended today, ER and return-to-clinic precautions discussed, patient verbalized understanding.     Wallis BambergMani, Lurie Mullane, [place] JerseyPA-C 07/22/18 1307

## 2018-07-20 NOTE — ED Triage Notes (Signed)
Patient presents to Urgent Care with complaints of needing a COVID test since exposure from [place] member.

## 2018-08-08 ENCOUNTER — Other Ambulatory Visit: Payer: Self-pay

## 2018-08-08 DIAGNOSIS — Z20822 Contact with and (suspected) exposure to covid-19: Secondary | ICD-10-CM

## 2018-08-09 LAB — NOVEL CORONAVIRUS, NAA: SARS-CoV-2, NAA: NOT DETECTED

## 2018-08-16 ENCOUNTER — Telehealth: Payer: Self-pay | Admitting: *Deleted

## 2018-08-16 NOTE — Telephone Encounter (Signed)
Monica Blake left a voice message today stating she did a Covid test a week ago  And is trying to find out the results. Per chart was seen at Urgent Care and sent for covid testing.   I called [name] [phone]  And left a message I was returning her call.  Seanmichael Salmons,RN

## 2018-08-17 ENCOUNTER — Telehealth: Payer: Self-pay | Admitting: *Deleted

## 2018-08-17 NOTE — Telephone Encounter (Signed)
Monica Blake called and left voice message this am asking for results of covid test.

## 2018-08-21 NOTE — Telephone Encounter (Signed)
Called pt using Eda, lvm that we are calling for results and to call the office back if she still needs these results.

## 2018-08-22 ENCOUNTER — Telehealth: Payer: Self-pay | Admitting: *Deleted

## 2018-08-22 NOTE — Telephone Encounter (Signed)
Monica Blake called this am and left a voice message she is calling for results of her covid test and missed call yesterday. I called her with Interpreter Eda Royal and notified her covid test on 08/08/18 was negative. She states she is happy to hear that . I asked how she is doing now. She states she is fine now; no symptoms.  She asked if she needs to get a test weekly. I explained she does not need to be tested weekly; only needs to get tested again if she develops symptoms again. She voices understanding. Linda,RN

## 2019-04-01 ENCOUNTER — Ambulatory Visit
Admission: RE | Admit: 2019-04-01 | Discharge: 2019-04-01 | Disposition: A | Discharge status: Home / Self Care | Payer: Medicaid Other | Source: Ambulatory Visit | Attending: Nurse Practitioner | Admitting: Nurse Practitioner

## 2019-04-01 ENCOUNTER — Other Ambulatory Visit: Payer: Self-pay

## 2019-04-01 DIAGNOSIS — Z01419 Encounter for gynecological examination (general) (routine) without abnormal findings: Secondary | ICD-10-CM

## 2019-08-21 ENCOUNTER — Other Ambulatory Visit: Payer: Self-pay

## 2019-08-21 ENCOUNTER — Other Ambulatory Visit: Payer: Medicaid Other

## 2019-08-21 DIAGNOSIS — Z20822 Contact with and (suspected) exposure to covid-19: Secondary | ICD-10-CM

## 2019-08-23 LAB — NOVEL CORONAVIRUS, NAA: SARS-CoV-2, NAA: NOT DETECTED

## 2019-08-23 LAB — SARS-COV-2, NAA 2 DAY TAT

## 2019-12-29 ENCOUNTER — Other Ambulatory Visit: Payer: Self-pay

## 2019-12-29 ENCOUNTER — Encounter (HOSPITAL_COMMUNITY): Payer: Self-pay

## 2019-12-29 ENCOUNTER — Ambulatory Visit (HOSPITAL_COMMUNITY)
Admission: EM | Admit: 2019-12-29 | Discharge: 2019-12-29 | Disposition: A | Discharge status: Home / Self Care | Payer: Medicaid Other | Attending: Urgent Care | Admitting: Urgent Care

## 2019-12-29 DIAGNOSIS — J45909 Unspecified asthma, uncomplicated: Secondary | ICD-10-CM | POA: Diagnosis not present

## 2019-12-29 DIAGNOSIS — B349 Viral infection, unspecified: Secondary | ICD-10-CM

## 2019-12-29 DIAGNOSIS — R059 Cough, unspecified: Secondary | ICD-10-CM | POA: Diagnosis present

## 2019-12-29 DIAGNOSIS — R0602 Shortness of breath: Secondary | ICD-10-CM | POA: Insufficient documentation

## 2019-12-29 DIAGNOSIS — J101 Influenza due to other identified influenza virus with other respiratory manifestations: Secondary | ICD-10-CM | POA: Diagnosis not present

## 2019-12-29 DIAGNOSIS — Z20822 Contact with and (suspected) exposure to covid-19: Secondary | ICD-10-CM | POA: Diagnosis not present

## 2019-12-29 DIAGNOSIS — Z79899 Other long term (current) drug therapy: Secondary | ICD-10-CM | POA: Diagnosis not present

## 2019-12-29 LAB — RESP PANEL BY RT-PCR (FLU A&B, COVID) ARPGX2
Influenza A by PCR: POSITIVE — AB
Influenza B by PCR: NEGATIVE
SARS Coronavirus 2 by RT PCR: NEGATIVE

## 2019-12-29 MED ORDER — PREDNISONE 20 MG PO TABS: ORAL_TABLET | ORAL | 0 refills | Status: DC

## 2019-12-29 MED ORDER — ACETAMINOPHEN 325 MG PO TABS
650.0000 mg | ORAL_TABLET | Freq: Once | ORAL | Status: AC
  Administered 2019-12-29: 650 mg via ORAL

## 2019-12-29 MED ORDER — BENZONATATE 100 MG PO CAPS: 100.0000 mg | ORAL_CAPSULE | Freq: Three times a day (TID) | ORAL | 0 refills | Status: DC | PRN

## 2019-12-29 MED ORDER — ACETAMINOPHEN 325 MG PO TABS
ORAL_TABLET | ORAL | Status: AC
  Filled 2019-12-29: qty 2

## 2019-12-29 MED ORDER — PROMETHAZINE-DM 6.25-15 MG/5ML PO SYRP: 5.0000 mL | ORAL_SOLUTION | Freq: Every evening | ORAL | 0 refills | Status: DC | PRN

## 2019-12-29 NOTE — ED Provider Notes (Signed)
[facility]   MRN: [id number] DOB: [birth year]  Subjective:   Monica Blake is a 49 y.o. female presenting for 2-day history of acute onset productive cough, shortness of breath, chest tightness, fatigue and body aches.  Patient has also had fever and chills, has been using Tylenol consistently except for this today.  She also has a history of asthma, has been using her albuterol inhaler much more frequently.  She is both Covid and flu vaccinated.  Denies history of smoking.  Denies history of chronic conditions apart from asthma.  No current facility-administered medications for this encounter.  Current Outpatient Medications:  .  flintstones complete (FLINTSTONES) 60 MG chewable tablet, Chew 1 tablet by mouth daily. , Disp: , Rfl:    No Known Allergies  Past Medical History:  Diagnosis Date  . Medical history non-contributory   . Preterm labor    1998     Past Surgical History:  Procedure Laterality Date  . CESAREAN SECTION N/A 11/23/2012   Procedure: PRIMARY CESAREAN SECTION;  Surgeon: Willodean Rosenthal, MD;  Location: [facility];  Service: Obstetrics;  Laterality: N/A;  . RECTAL SURGERY      Family History  Problem Relation Age of Onset  . Diabetes Father   . Healthy Mother     Social History   Tobacco Use  . Smoking status: Never Smoker  . Smokeless tobacco: Never Used  Vaping Use  . Vaping Use: Never used  Substance Use Topics  . Alcohol use: No  . Drug use: No    ROS   Objective:   Vitals: BP 115/74 (BP Location: Left Arm)   Pulse (!) 104   Temp (!) 100.6 F (38.1 C) (Oral)   Resp 18   SpO2 95%   Physical Exam Constitutional:      General: She is not in acute distress.    Appearance: Normal appearance. She is well-developed. She is not ill-appearing, toxic-appearing or diaphoretic.  HENT:     Head: Normocephalic and atraumatic.     Nose: Nose normal.     Mouth/Throat:     Mouth: Mucous membranes are moist.  Eyes:      Extraocular Movements: Extraocular movements intact.     Pupils: Pupils are equal, round, and reactive to light.  Cardiovascular:     Rate and Rhythm: Normal rate and regular rhythm.     Pulses: Normal pulses.     Heart sounds: Normal heart sounds. No murmur heard. No friction rub. No gallop.   Pulmonary:     Effort: Pulmonary effort is normal. No respiratory distress.     Breath sounds: No stridor. Examination of the right-middle field reveals wheezing. Examination of the left-middle field reveals wheezing. Examination of the right-lower field reveals wheezing. Examination of the left-lower field reveals wheezing. Wheezing (faint) present. No rhonchi or rales.  Skin:    General: Skin is warm and dry.     Findings: No rash.  Neurological:     Mental Status: She is alert and oriented to person, place, and time.  Psychiatric:        Mood and Affect: Mood normal.        Behavior: Behavior normal.        Thought Content: Thought content normal.       Assessment and Plan :   PDMP not reviewed this encounter.  1. Viral syndrome   2. Cough   3. Shortness of breath     In light of patient's  asthma, wheezing and shortness of breath despite using albuterol inhaler will use a prednisone course.  Otherwise, will manage for viral illness such as viral URI, viral syndrome, COVID-19, influenza. Counseled patient on nature of COVID-19 including modes of transmission, diagnostic testing, management and supportive care.  Offered scripts for symptomatic relief. COVID 19 testing is pending. Counseled patient on potential for adverse effects with medications prescribed/recommended today, ER and return-to-clinic precautions discussed, patient verbalized understanding.     Wallis Bamberg, PA-C 12/29/19 1138

## 2019-12-29 NOTE — ED Triage Notes (Signed)
Pt present productive cough with SOB, fatigue and bodyaches. Symptoms started two days ago. Pt has tried OTC medication with no relief.

## 2019-12-29 NOTE — Discharge Instructions (Addendum)

## 2020-01-01 ENCOUNTER — Encounter (HOSPITAL_COMMUNITY): Payer: Self-pay | Admitting: Urgent Care

## 2020-01-01 ENCOUNTER — Telehealth (HOSPITAL_COMMUNITY): Payer: Self-pay

## 2020-01-01 ENCOUNTER — Ambulatory Visit (HOSPITAL_COMMUNITY)
Admission: EM | Admit: 2020-01-01 | Discharge: 2020-01-01 | Disposition: A | Discharge status: Home / Self Care | Payer: BLUE CROSS/BLUE SHIELD | Attending: Urgent Care | Admitting: Urgent Care

## 2020-01-01 ENCOUNTER — Ambulatory Visit (INDEPENDENT_AMBULATORY_CARE_PROVIDER_SITE_OTHER): Payer: BLUE CROSS/BLUE SHIELD

## 2020-01-01 DIAGNOSIS — R059 Cough, unspecified: Secondary | ICD-10-CM

## 2020-01-01 DIAGNOSIS — J101 Influenza due to other identified influenza virus with other respiratory manifestations: Secondary | ICD-10-CM

## 2020-01-01 DIAGNOSIS — J4530 Mild persistent asthma, uncomplicated: Secondary | ICD-10-CM

## 2020-01-01 MED ORDER — HYDROCODONE-HOMATROPINE 5-1.5 MG/5ML PO SYRP: 5.0000 mL | ORAL_SOLUTION | Freq: Every evening | ORAL | 0 refills | Status: DC | PRN

## 2020-01-01 MED ORDER — PSEUDOEPHEDRINE HCL 30 MG PO TABS: 30.0000 mg | ORAL_TABLET | Freq: Three times a day (TID) | ORAL | 0 refills | Status: DC | PRN

## 2020-01-01 MED ORDER — CETIRIZINE HCL 10 MG PO TABS: 10.0000 mg | ORAL_TABLET | Freq: Every day | ORAL | 0 refills | Status: DC

## 2020-01-01 NOTE — ED Triage Notes (Signed)
Pt presents with cough x 5 days. Tested positive for Influenza A 3 days ago.

## 2020-01-01 NOTE — ED Provider Notes (Signed)
[facility]   MRN: [id number] DOB: [birth year]  Subjective:   Monica Blake is a 49 y.o. female presenting for recheck on her cough illness.  Patient was last seen this past weekend, tested positive for influenza.  I have prescribed patient cough suppression medications and prednisone in light of her asthma respiratory symptoms.  Patient states that she still has a difficult cough, keeps her up at night.  Her other symptoms are improved but the cough is caused significant fatigue.  Today's last day of her prednisone course.  No current facility-administered medications for this encounter.  Current Outpatient Medications:  .  benzonatate (TESSALON) 100 MG capsule, Take 1-2 capsules (100-200 mg total) by mouth 3 (three) times daily as needed for cough., Disp: 60 capsule, Rfl: 0 .  flintstones complete (FLINTSTONES) 60 MG chewable tablet, Chew 1 tablet by mouth daily. , Disp: , Rfl:  .  predniSONE (DELTASONE) 20 MG tablet, Take 2 tablets daily with breakfast., Disp: 10 tablet, Rfl: 0 .  promethazine-dextromethorphan (PROMETHAZINE-DM) 6.25-15 MG/5ML syrup, Take 5 mLs by mouth at bedtime as needed for cough., Disp: 100 mL, Rfl: 0   No Known Allergies  Past Medical History:  Diagnosis Date  . Medical history non-contributory   . Preterm labor    1998     Past Surgical History:  Procedure Laterality Date  . CESAREAN SECTION N/A 11/23/2012   Procedure: PRIMARY CESAREAN SECTION;  Surgeon: Willodean Rosenthal, MD;  Location: [facility];  Service: Obstetrics;  Laterality: N/A;  . RECTAL SURGERY      Family History  Problem Relation Age of Onset  . Diabetes Father   . Healthy Mother     Social History   Tobacco Use  . Smoking status: Never Smoker  . Smokeless tobacco: Never Used  Vaping Use  . Vaping Use: Never used  Substance Use Topics  . Alcohol use: No  . Drug use: No    ROS   Objective:   Vitals: BP 110/76 (BP Location: Right Arm)   Pulse 77    Temp 98.8 F (37.1 C) (Oral)   Resp (!) 21   SpO2 95%   Pulse oximetry 95%, pulse of 79 bpm, RR 16.  Physical Exam Constitutional:      General: She is not in acute distress.    Appearance: Normal appearance. She is well-developed. She is ill-appearing. She is not toxic-appearing or diaphoretic.  HENT:     Head: Normocephalic and atraumatic.     Nose: Nose normal.     Mouth/Throat:     Mouth: Mucous membranes are moist.  Eyes:     Extraocular Movements: Extraocular movements intact.     Pupils: Pupils are equal, round, and reactive to light.  Cardiovascular:     Rate and Rhythm: Normal rate and regular rhythm.     Pulses: Normal pulses.     Heart sounds: Normal heart sounds. No murmur heard. No friction rub. No gallop.   Pulmonary:     Effort: Pulmonary effort is normal. No respiratory distress.     Breath sounds: Normal breath sounds. No stridor. No wheezing, rhonchi or rales.  Skin:    General: Skin is warm and dry.     Findings: No rash.  Neurological:     Mental Status: She is alert and oriented to person, place, and time.  Psychiatric:        Mood and Affect: Mood normal.        Behavior: Behavior normal.  Thought Content: Thought content normal.     DG Chest 2 View  Result Date: 01/01/2020 CLINICAL DATA:  Cough EXAM: CHEST - 2 VIEW COMPARISON:  None. FINDINGS: Frontal and lateral views of the chest demonstrate an unremarkable cardiac silhouette. No acute airspace disease, effusion, or pneumothorax. There are no acute bony abnormalities. IMPRESSION: 1. No acute intrathoracic process. Electronically Signed   By: Sharlet Salina M.D.   On: 01/01/2020 18:32    Assessment and Plan :   PDMP not reviewed this encounter.  1. Influenza A   2. Cough   3. Mild persistent asthma without complication     Recommended continued supportive care with Zyrtec, Sudafed.  Patient states the most bothersome problem is her cough.  Will offer her prescription for Hycodan.  Counseled patient on potential for adverse effects with medications prescribed/recommended today, ER and return-to-clinic precautions discussed, patient verbalized understanding.    Wallis Bamberg, [place] 01/01/20 1926

## 2020-08-24 ENCOUNTER — Other Ambulatory Visit: Payer: Self-pay | Admitting: Internal Medicine

## 2020-08-24 DIAGNOSIS — Z1231 Encounter for screening mammogram for malignant neoplasm of breast: Secondary | ICD-10-CM

## 2020-09-24 ENCOUNTER — Ambulatory Visit
Admission: RE | Admit: 2020-09-24 | Discharge: 2020-09-24 | Disposition: A | Discharge status: Home / Self Care | Payer: Medicaid Other | Source: Ambulatory Visit | Attending: Internal Medicine | Admitting: Internal Medicine

## 2020-09-24 ENCOUNTER — Other Ambulatory Visit: Payer: Self-pay

## 2020-09-24 DIAGNOSIS — Z1231 Encounter for screening mammogram for malignant neoplasm of breast: Secondary | ICD-10-CM

## 2020-10-06 ENCOUNTER — Other Ambulatory Visit: Payer: Self-pay | Admitting: Internal Medicine

## 2020-10-07 LAB — COMPLETE METABOLIC PANEL WITH GFR
AG Ratio: 1.3 (calc) (ref 1.0–2.5)
ALT: 21 U/L (ref 6–29)
AST: 25 U/L (ref 10–35)
Albumin: 4.3 g/dL (ref 3.6–5.1)
Alkaline phosphatase (APISO): 104 U/L (ref 37–153)
BUN: 14 mg/dL (ref 7–25)
CO2: 20 mmol/L (ref 20–32)
Calcium: 10.1 mg/dL (ref 8.6–10.4)
Chloride: 106 mmol/L (ref 98–110)
Creat: 0.7 mg/dL (ref 0.50–1.03)
Globulin: 3.3 g/dL (calc) (ref 1.9–3.7)
Glucose, Bld: 92 mg/dL (ref 65–99)
Potassium: 4.1 mmol/L (ref 3.5–5.3)
Sodium: 142 mmol/L (ref 135–146)
Total Bilirubin: 0.7 mg/dL (ref 0.2–1.2)
Total Protein: 7.6 g/dL (ref 6.1–8.1)
eGFR: 105 mL/min/{1.73_m2} (ref >=60)

## 2020-10-07 LAB — TSH: TSH: 1.81 mIU/L

## 2020-10-07 LAB — LIPID PANEL
Cholesterol: 215 mg/dL — ABNORMAL HIGH (ref <200)
HDL: 71 mg/dL (ref >=50)
LDL Cholesterol (Calc): 128 mg/dL (calc) — ABNORMAL HIGH
Non-HDL Cholesterol (Calc): 144 mg/dL (calc) — ABNORMAL HIGH (ref <130)
Total CHOL/HDL Ratio: 3 (calc) (ref <5.0)
Triglycerides: 72 mg/dL (ref <150)

## 2020-10-07 LAB — CBC
HCT: 39.3 % (ref 35.0–45.0)
Hemoglobin: 13.7 g/dL (ref 11.7–15.5)
MCH: 32.1 pg (ref 27.0–33.0)
MCHC: 34.9 g/dL (ref 32.0–36.0)
MCV: 92 fL (ref 80.0–100.0)
MPV: 9.6 fL (ref 7.5–12.5)
Platelets: 256 10*3/uL (ref 140–400)
RBC: 4.27 10*6/uL (ref 3.80–5.10)
RDW: 12.2 % (ref 11.0–15.0)
WBC: 5.8 10*3/uL (ref 3.8–10.8)

## 2020-10-07 LAB — VITAMIN D 25 HYDROXY (VIT D DEFICIENCY, FRACTURES): Vit D, 25-Hydroxy: 29 ng/mL — ABNORMAL LOW (ref 30–100)

## 2021-08-25 ENCOUNTER — Encounter (HOSPITAL_COMMUNITY): Payer: Self-pay | Admitting: *Deleted

## 2021-08-25 ENCOUNTER — Ambulatory Visit (HOSPITAL_COMMUNITY)
Admission: EM | Admit: 2021-08-25 | Discharge: 2021-08-25 | Disposition: A | Discharge status: Home / Self Care | Payer: Medicaid Other | Attending: Internal Medicine | Admitting: Internal Medicine

## 2021-08-25 ENCOUNTER — Other Ambulatory Visit: Payer: Self-pay

## 2021-08-25 DIAGNOSIS — R519 Headache, unspecified: Secondary | ICD-10-CM | POA: Insufficient documentation

## 2021-08-25 DIAGNOSIS — J069 Acute upper respiratory infection, unspecified: Secondary | ICD-10-CM

## 2021-08-25 DIAGNOSIS — U071 COVID-19: Secondary | ICD-10-CM | POA: Diagnosis not present

## 2021-08-25 HISTORY — DX: Unspecified asthma, uncomplicated: J45.909

## 2021-08-25 MED ORDER — IBUPROFEN 800 MG PO TABS
ORAL_TABLET | ORAL | Status: AC
  Filled 2021-08-25: qty 1

## 2021-08-25 MED ORDER — IBUPROFEN 800 MG PO TABS
800.0000 mg | ORAL_TABLET | Freq: Once | ORAL | Status: AC
  Administered 2021-08-25: 800 mg via ORAL

## 2021-08-25 MED ORDER — ACETAMINOPHEN 500 MG PO TABS: 1000.0000 mg | ORAL_TABLET | Freq: Four times a day (QID) | ORAL | 0 refills | Status: AC | PRN

## 2021-08-25 MED ORDER — GUAIFENESIN ER 1200 MG PO TB12: 1200.0000 mg | ORAL_TABLET | Freq: Two times a day (BID) | ORAL | 0 refills | Status: AC

## 2021-08-25 MED ORDER — IBUPROFEN 600 MG PO TABS: 600.0000 mg | ORAL_TABLET | Freq: Four times a day (QID) | ORAL | 0 refills | Status: AC | PRN

## 2021-08-25 NOTE — Discharge Instructions (Addendum)
You have a viral upper respiratory infection.  Your COVID-19 testing is pending.  We will call you if your testing is positive.  Your work note was at the end of the packet.  Take guaifenesin 1200mg   2 times daily to thin your mucous so that you can cough it up and blow it out of your nose easier. Drink plenty of water while taking this medication so that it works well in your body (at least 8 cups a day).   You may take tylenol 1,000mg  and ibuprofen 600mg  every 6 hours with food as needed for fever/chills, sore throat, aches/pains, and inflammation associated with viral illness. Take this with food to avoid stomach upset.    Your next dose of ibuprofen may be tomorrow morning at 5am if needed.   If you develop any new or worsening symptoms, please return.  If your symptoms are severe, please go to the emergency room.  Follow-up with your primary care provider for further evaluation and management of your symptoms as well as ongoing wellness visits.  I hope you feel better!

## 2021-08-25 NOTE — ED Provider Notes (Signed)
[facility]    CSN: [id number] Arrival date & time: 08/25/21  1817      History   Chief Complaint Chief Complaint  Patient presents with   Headache   Generalized Body Aches   Nasal Congestion    HPI Monica Blake is a 51 y.o. female.   Patient presents urgent care for evaluation of headache, stuffy nose, fever, and body aches that all started yesterday.  Patient recently went on a beach trip with her family and believes that she may have caught a viral illness from this.  States she has a history of asthma and has been using her albuterol inhaler once daily in the mornings with relief while she has been sick.  Patient denies cough, shortness of breath, chest pain, neck pain, diarrhea, constipation, nausea, vomiting, abdominal pain, back pain, and dizziness.  Denies watery itchy eyes, sore throat, and history of hospitalization related to COVID-19.  Patient is vaccinated fully against COVID-19 and is not a smoker. Headache is generalized and currently at a 6 on a scale of 0-10.  Patient denies blurry vision and decreased visual acuity.  She has attempted use of ibuprofen over-the-counter at home prior to arrival urgent care with some relief of her symptoms although last dose was yesterday.  She has also attempted use of a decongestant over-the-counter and states that this has helped to thin the mucus but she continues to have a significant amount of nasal congestion.  Patient has not taken any medications prior to arrival to urgent care today for symptoms.    Headache   Past Medical History:  Diagnosis Date   Asthma    Medical history non-contributory    Preterm labor    1998    Patient Active Problem List   Diagnosis Date Noted   Presence of IUD 02/27/2018   Asthma 06/04/2012    Past Surgical History:  Procedure Laterality Date   CESAREAN SECTION N/A 11/23/2012   Procedure: PRIMARY CESAREAN SECTION;  Surgeon: Lavonia Drafts, MD;  Location: [facility];   Service: Obstetrics;  Laterality: N/A;   RECTAL SURGERY      OB History     Gravida  4   Para  4   Term  3   Preterm  1   AB      Living  4      SAB      IAB      Ectopic      Multiple      Live Births  4            Home Medications    Prior to Admission medications   Medication Sig Start Date End Date Taking? Authorizing Provider  acetaminophen (TYLENOL) 500 MG tablet Take 2 tablets (1,000 mg total) by mouth every 6 (six) hours as needed. 08/25/21  Yes Talbot Grumbling, FNP  Guaifenesin 1200 MG TB12 Take 1 tablet (1,200 mg total) by mouth in the morning and at bedtime. 08/25/21  Yes Talbot Grumbling, FNP  ibuprofen (ADVIL) 600 MG tablet Take 1 tablet (600 mg total) by mouth every 6 (six) hours as needed. 08/25/21  Yes Talbot Grumbling, FNP  flintstones complete (FLINTSTONES) 60 MG chewable tablet Chew 1 tablet by mouth daily.     [provider]    Family History Family History  Problem Relation Age of Onset   Diabetes Father    Healthy Mother     Social History Social History   Tobacco Use  Smoking status: Never   Smokeless tobacco: Never  Vaping Use   Vaping Use: Never used  Substance Use Topics   Alcohol use: No   Drug use: No     Allergies   Patient has no known allergies.   Review of Systems Review of Systems  Neurological:  Positive for headaches.  Per HPI   Physical Exam Triage Vital Signs ED Triage Vitals  Enc Vitals Group     BP 08/25/21 1835 107/73     Pulse Rate 08/25/21 1835 75     Resp 08/25/21 1835 20     Temp 08/25/21 1835 99 F (37.2 C)     Temp src --      SpO2 08/25/21 1835 99 %     Weight --      Height --      Head Circumference --      Peak Flow --      Pain Score 08/25/21 1832 6     Pain Loc --      Pain Edu? --      Excl. in [place]? --    No data found.  Updated Vital Signs BP 107/73   Pulse 75   Temp 99 F (37.2 C)   Resp 20   SpO2 99%   Visual Acuity Right Eye  Distance:   Left Eye Distance:   Bilateral Distance:    Right Eye Near:   Left Eye Near:    Bilateral Near:     Physical Exam Vitals and nursing note reviewed.  Constitutional:      Appearance: Normal appearance. She is ill-appearing. She is not toxic-appearing.     Comments: Very pleasant patient sitting on exam in position of comfort table in no acute distress.   HENT:     Head: Normocephalic and atraumatic.     Right Ear: Hearing, tympanic membrane, ear canal and external ear normal.     Left Ear: Hearing, tympanic membrane, ear canal and external ear normal.     Nose: Congestion present.     Right Turbinates: Swollen.     Left Turbinates: Swollen.     Mouth/Throat:     Lips: Pink.     Mouth: Mucous membranes are moist.     Pharynx: Oropharynx is clear. Uvula midline. Posterior oropharyngeal erythema present.     Comments: Moderate amount of drainage to the posterior oropharynx.  Eyes:     General: Lids are normal. Vision grossly intact. Gaze aligned appropriately.     Extraocular Movements: Extraocular movements intact.     Conjunctiva/sclera: Conjunctivae normal.     Pupils: Pupils are equal, round, and reactive to light.  Cardiovascular:     Rate and Rhythm: Normal rate and regular rhythm.     Heart sounds: Normal heart sounds, S1 normal and S2 normal.  Pulmonary:     Effort: Pulmonary effort is normal. No respiratory distress.     Breath sounds: Normal breath sounds and air entry.     Comments: Clear to auscultation bilaterally. Abdominal:     General: Bowel sounds are normal.     Palpations: Abdomen is soft.     Tenderness: There is no abdominal tenderness.  Musculoskeletal:     Cervical back: Neck supple.  Lymphadenopathy:     Cervical: Cervical adenopathy present.  Skin:    General: Skin is warm and dry.     Capillary Refill: Capillary refill takes less than 2 seconds.     Findings: No rash.  Neurological:  General: No focal deficit present.     Mental  Status: She is alert and oriented to person, place, and time. Mental status is at baseline.     GCS: GCS eye subscore is 4. GCS verbal subscore is 5. GCS motor subscore is 6.     Cranial Nerves: No dysarthria or facial asymmetry.     Gait: Gait is intact.  Psychiatric:        Mood and Affect: Mood normal.        Speech: Speech normal.        Behavior: Behavior normal.        Thought Content: Thought content normal.        Judgment: Judgment normal.      UC Treatments / Results  Labs (all labs ordered are listed, but only abnormal results are displayed) Labs Reviewed  SARS CORONAVIRUS 2 (TAT 6-24 HRS)    EKG   Radiology No results found.  Procedures Procedures (including critical care time)  Medications Ordered in UC Medications  ibuprofen (ADVIL) tablet 800 mg (has no administration in time range)    Initial Impression / Assessment and Plan / UC Course  I have reviewed the triage vital signs and the nursing notes.  Pertinent labs & imaging results that were available during my care of the patient were reviewed by me and considered in my medical decision making (see chart for details).   1. Viral URI and bad headache Symptoms and physical exam consistent with a viral upper respiratory tract infection that will likely resolve with rest, fluids, and prescriptions for symptomatic relief.  COVID-19 testing is pending.  We will call patient if this is positive.  She is a candidate for Paxlovid therapy based on history of asthma  Guaifenesin, Tessalon Perles, and Tylenol/ibuprofen sent to the pharmacy to be used for nasal congestion, cough, and body aches, fever/chills, and discomfort associated with viral illness.  Nonpharmacologic interventions for symptom relief provided and after visit summary below.  Strict ED/urgent care return precautions given.  Patient verbalizes understanding and agreement with plan.  Counseled patient regarding possible side effects and uses of all  medications prescribed at today's visit.  Patient verbalizes understanding and agreement with plan.  All questions answered.  Patient discharged from urgent care in stable condition.    *** Final Clinical Impressions(s) / UC Diagnoses   Final diagnoses:  Viral upper respiratory tract infection  Bad headache     Discharge Instructions      You have a viral upper respiratory infection.  Your COVID-19 testing is pending.  We will call you if your testing is positive.  Your work note was at the end of the packet.  Take guaifenesin 1200mg   2 times daily to thin your mucous so that you can cough it up and blow it out of your nose easier. Drink plenty of water while taking this medication so that it works well in your body (at least 8 cups a day).   You may take tylenol 1,000mg  and ibuprofen 600mg  every 6 hours with food as needed for fever/chills, sore throat, aches/pains, and inflammation associated with viral illness. Take this with food to avoid stomach upset.    Your next dose of ibuprofen may be tomorrow morning at 5am if needed.   If you develop any new or worsening symptoms, please return.  If your symptoms are severe, please go to the emergency room.  Follow-up with your primary care provider for further evaluation and management of your  symptoms as well as ongoing wellness visits.  I hope you feel better!    ED Prescriptions     Medication Sig Dispense Auth. Provider   ibuprofen (ADVIL) 600 MG tablet Take 1 tablet (600 mg total) by mouth every 6 (six) hours as needed. 30 tablet Reita May M, FNP   acetaminophen (TYLENOL) 500 MG tablet Take 2 tablets (1,000 mg total) by mouth every 6 (six) hours as needed. 30 tablet Reita May M, FNP   Guaifenesin 1200 MG TB12 Take 1 tablet (1,200 mg total) by mouth in the morning and at bedtime. 14 tablet Carlisle Beers, FNP      PDMP not reviewed this encounter.

## 2021-08-25 NOTE — ED Triage Notes (Signed)
Pt reports having nasal congestion ,HA and body aches that started yesterday.

## 2021-08-26 LAB — SARS CORONAVIRUS 2 (TAT 6-24 HRS): SARS Coronavirus 2: POSITIVE — AB

## 2021-12-20 ENCOUNTER — Other Ambulatory Visit: Payer: Self-pay | Admitting: Internal Medicine

## 2021-12-20 DIAGNOSIS — Z1231 Encounter for screening mammogram for malignant neoplasm of breast: Secondary | ICD-10-CM

## 2021-12-23 LAB — COLOGUARD: COLOGUARD: NEGATIVE

## 2021-12-23 LAB — EXTERNAL GENERIC LAB PROCEDURE: COLOGUARD: NEGATIVE

## 2022-02-14 ENCOUNTER — Ambulatory Visit
Admission: RE | Admit: 2022-02-14 | Discharge: 2022-02-14 | Disposition: A | Discharge status: Home / Self Care | Payer: Medicaid Other | Source: Ambulatory Visit | Attending: Internal Medicine | Admitting: Internal Medicine

## 2022-02-14 DIAGNOSIS — Z1231 Encounter for screening mammogram for malignant neoplasm of breast: Secondary | ICD-10-CM
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
## 2023-04-06 ENCOUNTER — Other Ambulatory Visit: Payer: Self-pay | Admitting: Internal Medicine

## 2023-04-06 DIAGNOSIS — Z1231 Encounter for screening mammogram for malignant neoplasm of breast: Secondary | ICD-10-CM

## 2023-04-07 ENCOUNTER — Ambulatory Visit
Admission: RE | Admit: 2023-04-07 | Discharge: 2023-04-07 | Disposition: A | Discharge status: Home / Self Care | Source: Ambulatory Visit | Attending: Internal Medicine | Admitting: Internal Medicine

## 2023-04-07 DIAGNOSIS — Z1231 Encounter for screening mammogram for malignant neoplasm of breast: Secondary | ICD-10-CM

## 2023-08-17 ENCOUNTER — Encounter: Admitting: Physician Assistant

## 2023-08-17 NOTE — Progress Notes (Deleted)
 ANNUAL EXAM Patient name: Monica Blake MRN [id number]  Date of birth: 1970-03-29 Chief Complaint:   No chief complaint on file.  History of Present Illness:   Monica Blake is a 53 y.o. [phone] {race:25618} female being seen today for a routine annual exam.   Current complaints: ***  No LMP recorded. (Menstrual status: IUD).   The pregnancy intention screening data noted above was reviewed. Potential methods of contraception were discussed. The patient elected to proceed with No data recorded.   Last pap 02/27/18. Results were: NILM w/ HRHPV negative. H/O abnormal pap: {yes/yes***/no:23866} Last mammogram: 04/07/23. Results were: normal. Family h/o breast cancer: {yes***/no:23838} Last colonoscopy: 12/15/21 (cologuard). Results were: normal. Family h/o colorectal cancer: {yes***/no:23838} STI screening:       02/27/2018    3:52 PM  Depression screen PHQ 2/9  Decreased Interest 0  Down, Depressed, Hopeless 1  PHQ - 2 Score 1  Altered sleeping 0  Tired, decreased energy 0  Change in appetite 0  Feeling bad or failure about yourself  0  Trouble concentrating 0  Moving slowly or fidgety/restless 0  Suicidal thoughts 0  PHQ-9 Score 1        02/27/2018    3:52 PM  GAD 7 : Generalized Anxiety Score  Nervous, Anxious, on Edge 0  Control/stop worrying 0  Worry too much - different things 0  Trouble relaxing 0  Restless 0  Easily annoyed or irritable 1  Afraid - awful might happen 0  Total GAD 7 Score 1     Review of Systems:   Pertinent items are noted in HPI Denies any headaches, blurred vision, fatigue, shortness of breath, chest pain, abdominal pain, abnormal vaginal discharge/itching/odor/irritation, problems with periods, bowel movements, urination, or intercourse unless otherwise stated above. Pertinent History Reviewed:  Reviewed past medical,surgical, social and family history.  Reviewed problem list, medications and allergies. Physical Assessment:   There were no vitals filed for this visit.There is no height or weight on file to calculate BMI.        Physical Examination:   General appearance - well appearing, and in no distress  Mental status - alert, oriented to person, place, and time  Psych:  She has a normal mood and affect  Skin - warm and dry, normal color, no suspicious lesions noted  Chest - effort normal, all lung fields clear to auscultation bilaterally  Heart - normal rate and regular rhythm  Neck:  midline trachea, no thyromegaly or nodules  Breasts - breasts appear normal, no suspicious masses, no skin or nipple changes or  axillary nodes  Abdomen - soft, nontender, nondistended, no masses or organomegaly  Pelvic - VULVA: normal appearing vulva with no masses, tenderness or lesions  VAGINA: normal appearing vagina with normal color and discharge, no lesions  CERVIX: normal appearing cervix without discharge or lesions, no CMT  Thin prep pap is {Desc; done/not:10129} *** HR HPV cotesting  UTERUS: uterus is felt to be normal size, shape, consistency and nontender   ADNEXA: No adnexal masses or tenderness noted.  Rectal - normal rectal, good sphincter tone, no masses felt. Hemoccult: ***  Extremities:  No swelling or varicosities noted  Chaperone present for exam  No results found for this or any previous visit (from the past 24 hours).  Assessment & Plan:  1) Well-Woman Exam  2) ***  Labs/procedures today: ***  Mammogram: {Mammo f/u:25212::@ 53yo}, or sooner if problems Colonoscopy: {TCS f/u:25213::@ 53yo}, or sooner if problems  No orders of the defined types were placed in this encounter.   Meds: No orders of the defined types were placed in this encounter.   Follow-up: No follow-ups on file.  Zlata Alcaide E Alexias Margerum, PA-C 08/17/2023 7:41 AM

## 2023-08-31 ENCOUNTER — Encounter: Admitting: Obstetrics and Gynecology

## 2023-11-09 ENCOUNTER — Ambulatory Visit (INDEPENDENT_AMBULATORY_CARE_PROVIDER_SITE_OTHER): Admitting: Physician Assistant

## 2023-11-09 ENCOUNTER — Other Ambulatory Visit (HOSPITAL_COMMUNITY)
Admission: RE | Admit: 2023-11-09 | Discharge: 2023-11-09 | Disposition: A | Source: Ambulatory Visit | Attending: Physician Assistant | Admitting: Physician Assistant

## 2023-11-09 ENCOUNTER — Encounter: Payer: Self-pay | Admitting: Physician Assistant

## 2023-11-09 VITALS — BP 114/70 | HR 66 | Ht 63.0 in | Wt 177.4 lb

## 2023-11-09 DIAGNOSIS — Z113 Encounter for screening for infections with a predominantly sexual mode of transmission: Secondary | ICD-10-CM | POA: Diagnosis present

## 2023-11-09 DIAGNOSIS — Z30432 Encounter for removal of intrauterine contraceptive device: Secondary | ICD-10-CM

## 2023-11-09 DIAGNOSIS — Z01419 Encounter for gynecological examination (general) (routine) without abnormal findings: Secondary | ICD-10-CM

## 2023-11-09 DIAGNOSIS — Z1211 Encounter for screening for malignant neoplasm of colon: Secondary | ICD-10-CM

## 2023-11-09 DIAGNOSIS — Z1231 Encounter for screening mammogram for malignant neoplasm of breast: Secondary | ICD-10-CM

## 2023-11-09 DIAGNOSIS — Z124 Encounter for screening for malignant neoplasm of cervix: Secondary | ICD-10-CM

## 2023-11-09 NOTE — Progress Notes (Unsigned)
 Pt presents for AEX.  Last PAP 02/2018 Mammogram 04/2023 Cologard done  Paragard  IUD Placed 4-5 years ago Requesting STD testing

## 2023-11-09 NOTE — Progress Notes (Unsigned)
 ANNUAL EXAM Patient name: Monica Blake MRN [id number]  Date of birth: October 10, 1970 Chief Complaint:   No chief complaint on file.  History of Present Illness:   Monica Blake is a 53 y.o. [phone] {race:25618} female being seen today for a routine annual exam.   Current complaints: ***  No LMP recorded. (Menstrual status: IUD).  The pregnancy intention screening data noted above was reviewed. Potential methods of contraception were discussed. The patient elected to proceed with No data recorded.   Last pap 02/27/18. Results were: {Pap findings:25134}. H/O abnormal pap: {yes/yes***/no:23866} Last mammogram: 04/07/23. Results were: normal. Family h/o breast cancer: {yes***/no:23838} Last colonoscopy: Cologuard 2023. Results were: normal. Family h/o colorectal cancer: {yes***/no:23838} STI screening: Accepts Contraception: Paragard  IUD placed during pandemic     02/27/2018    3:52 PM  Depression screen PHQ 2/9  Decreased Interest 0  Down, Depressed, Hopeless 1  PHQ - 2 Score 1  Altered sleeping 0  Tired, decreased energy 0  Change in appetite 0  Feeling bad or failure about yourself  0  Trouble concentrating 0  Moving slowly or fidgety/restless 0  Suicidal thoughts 0  PHQ-9 Score 1        02/27/2018    3:52 PM  GAD 7 : Generalized Anxiety Score  Nervous, Anxious, on Edge 0  Control/stop worrying 0  Worry too much - different things 0  Trouble relaxing 0  Restless 0  Easily annoyed or irritable 1  Afraid - awful might happen 0  Total GAD 7 Score 1     Review of Systems:   Pertinent items are noted in HPI Denies any headaches, blurred vision, fatigue, shortness of breath, chest pain, abdominal pain, abnormal vaginal discharge/itching/odor/irritation, problems with periods, bowel movements, urination, or intercourse unless otherwise stated above. Pertinent History Reviewed:  Reviewed past medical,surgical, social and family history.  Reviewed problem list,  medications and allergies. Physical Assessment:  There were no vitals filed for this visit.There is no height or weight on file to calculate BMI.        Physical Examination:   General appearance - well appearing, and in no distress  Mental status - alert, oriented to person, place, and time  Psych:  She has a normal mood and affect  Skin - warm and dry, normal color, no suspicious lesions noted  Chest - effort normal, all lung fields clear to auscultation bilaterally  Heart - normal rate and regular rhythm  Neck:  midline trachea, no thyromegaly or nodules  Breasts - breasts appear normal, no suspicious masses, no skin or nipple changes or  axillary nodes  Abdomen - soft, nontender, nondistended, no masses or organomegaly  Pelvic - VULVA: normal appearing vulva with no masses, tenderness or lesions  VAGINA: normal appearing vagina with normal color and discharge, no lesions  CERVIX: normal appearing cervix without discharge or lesions, no CMT  Thin prep pap is {Desc; done/not:10129} *** HR HPV cotesting  UTERUS: uterus is felt to be normal size, shape, consistency and nontender   ADNEXA: No adnexal masses or tenderness noted.  Extremities:  No swelling or varicosities noted  Chaperone present for exam  No results found for this or any previous visit (from the past 24 hours).  Assessment & Plan:   1. Encounter for annual routine gynecological examination (Primary) - Cervical cancer screening: Discussed guidelines. Pap with HPV *** - Breast Health: Encouraged self breast awareness/SBE. Discussed limits of clinical breast exam for detecting breast cancer. Discussed importance of  annual MXR. {Blank single:19197::Rx given for [facility] is up to date: ***} - Climacteric/Sexual health: Reviewed typical and atypical symptoms of menopause/peri-menopause. Discussed PMB and to call if any amount of spotting.  - Colonoscopy: {Blank single:19197::Per PCP,up to date,declines} - F/U 12 months  and prn   2. Cervical cancer screening ***  3. Breast cancer screening by mammogram ***  4. Colon cancer screening ***   No orders of the defined types were placed in this encounter.   Meds: No orders of the defined types were placed in this encounter.   Follow-up: No follow-ups on file.  Monica Blake E Monica Blake, [place] 11/09/2023 8:23 AM

## 2023-11-10 LAB — CERVICOVAGINAL ANCILLARY ONLY
Chlamydia: NEGATIVE
Comment: NEGATIVE
Comment: NEGATIVE
Comment: NORMAL
Neisseria Gonorrhea: NEGATIVE
Trichomonas: NEGATIVE

## 2023-11-15 LAB — CYTOLOGY - PAP
Adequacy: ABNORMAL
Comment: NEGATIVE
High risk HPV: NEGATIVE

## 2023-11-17 ENCOUNTER — Ambulatory Visit: Payer: Self-pay

## 2023-11-27 ENCOUNTER — Telehealth: Payer: Self-pay
